# Patient Record
Sex: Male | Born: 1945 | Race: White | Hispanic: No | Marital: Married | Smoking: Former smoker
Health system: Southern US, Community
[De-identification: ages and names within clinical notes are randomized; demographics above are authoritative.]

## PROBLEM LIST (undated history)

## (undated) DIAGNOSIS — M1990 Unspecified osteoarthritis, unspecified site: Secondary | ICD-10-CM

## (undated) HISTORY — PX: FINGER AMPUTATION: SHX636

## (undated) HISTORY — DX: Unspecified osteoarthritis, unspecified site: M19.90

## (undated) HISTORY — PX: FOOT SURGERY: SHX648

---

## 2009-11-19 ENCOUNTER — Ambulatory Visit: Payer: Self-pay | Admitting: Internal Medicine

## 2010-06-26 DIAGNOSIS — Z720 Tobacco use: Secondary | ICD-10-CM | POA: Insufficient documentation

## 2010-06-26 DIAGNOSIS — J309 Allergic rhinitis, unspecified: Secondary | ICD-10-CM | POA: Insufficient documentation

## 2010-06-26 DIAGNOSIS — E7800 Pure hypercholesterolemia, unspecified: Secondary | ICD-10-CM | POA: Insufficient documentation

## 2010-09-11 DIAGNOSIS — L570 Actinic keratosis: Secondary | ICD-10-CM | POA: Insufficient documentation

## 2010-09-11 DIAGNOSIS — Z85828 Personal history of other malignant neoplasm of skin: Secondary | ICD-10-CM | POA: Insufficient documentation

## 2011-04-20 ENCOUNTER — Ambulatory Visit: Payer: Self-pay | Admitting: Internal Medicine

## 2014-01-20 DIAGNOSIS — N400 Enlarged prostate without lower urinary tract symptoms: Secondary | ICD-10-CM | POA: Insufficient documentation

## 2014-01-20 DIAGNOSIS — E7800 Pure hypercholesterolemia, unspecified: Secondary | ICD-10-CM | POA: Insufficient documentation

## 2014-05-25 ENCOUNTER — Ambulatory Visit: Payer: Self-pay | Admitting: Family Medicine

## 2014-07-04 DIAGNOSIS — Z83511 Family history of glaucoma: Secondary | ICD-10-CM | POA: Insufficient documentation

## 2014-10-10 ENCOUNTER — Other Ambulatory Visit: Payer: Self-pay | Admitting: Otolaryngology

## 2014-10-10 DIAGNOSIS — J3800 Paralysis of vocal cords and larynx, unspecified: Secondary | ICD-10-CM

## 2014-10-13 ENCOUNTER — Ambulatory Visit
Admission: RE | Admit: 2014-10-13 | Discharge: 2014-10-13 | Disposition: A | Discharge status: Home / Self Care | Payer: Medicare Other | Source: Ambulatory Visit | Attending: Otolaryngology | Admitting: Otolaryngology

## 2014-10-13 DIAGNOSIS — J3801 Paralysis of vocal cords and larynx, unilateral: Secondary | ICD-10-CM | POA: Diagnosis not present

## 2014-10-13 DIAGNOSIS — R490 Dysphonia: Secondary | ICD-10-CM | POA: Insufficient documentation

## 2014-10-13 DIAGNOSIS — I700 Atherosclerosis of aorta: Secondary | ICD-10-CM | POA: Insufficient documentation

## 2014-10-13 DIAGNOSIS — J3800 Paralysis of vocal cords and larynx, unspecified: Secondary | ICD-10-CM | POA: Diagnosis present

## 2014-10-13 MED ORDER — IOHEXOL 350 MG/ML SOLN
75.0000 mL | Freq: Once | INTRAVENOUS | Status: AC | PRN
  Administered 2014-10-13: 75 mL via INTRAVENOUS

## 2014-10-25 DIAGNOSIS — I712 Thoracic aortic aneurysm, without rupture: Secondary | ICD-10-CM | POA: Insufficient documentation

## 2014-10-25 DIAGNOSIS — I7121 Aneurysm of the ascending aorta, without rupture: Secondary | ICD-10-CM | POA: Insufficient documentation

## 2015-10-25 ENCOUNTER — Ambulatory Visit (INDEPENDENT_AMBULATORY_CARE_PROVIDER_SITE_OTHER): Payer: Medicare Other

## 2015-10-25 ENCOUNTER — Ambulatory Visit (INDEPENDENT_AMBULATORY_CARE_PROVIDER_SITE_OTHER): Payer: Medicare Other | Admitting: Podiatry

## 2015-10-25 ENCOUNTER — Encounter: Payer: Self-pay | Admitting: Podiatry

## 2015-10-25 VITALS — BP 133/84 | HR 80 | Resp 16

## 2015-10-25 DIAGNOSIS — M1990 Unspecified osteoarthritis, unspecified site: Secondary | ICD-10-CM | POA: Insufficient documentation

## 2015-10-25 DIAGNOSIS — M2011 Hallux valgus (acquired), right foot: Secondary | ICD-10-CM | POA: Diagnosis not present

## 2015-10-25 DIAGNOSIS — G5791 Unspecified mononeuropathy of right lower limb: Secondary | ICD-10-CM

## 2015-10-25 MED ORDER — DICLOFENAC SODIUM 1 % TD GEL: 4.0000 g | Freq: Four times a day (QID) | TRANSDERMAL | Status: DC

## 2015-10-25 NOTE — Progress Notes (Signed)
   Subjective:    Patient ID: Jaime Blackburn, male    DOB: 01/05/1946, 70 y.o.   MRN: [id number]  HPI: He presents today with a 9 month history of pressure and electrical pains across the first metatarsophalangeal joint and the fifth metatarsophalangeal joints right foot. He states that sometimes it feels like her foot is being squeezed the device and twisted. He states that he runs daily and does over thousand crunches a day. He vehemently denies any back problems or sciatic problems. He states that the pain only comes in the evening after he's been running for long distance and once he goes to bed he feels this discomfort across the top of the foot the dorsomedial and dorsolateral aspects. He states that he can roll over on his stomach position his foot against the mattress and the pain will go away. He denies any trauma but he does state that he wears his shoes incredibly tight. He states that his primary doctor suggested that it may be early signs of neuropathy which he does not agree with he also states that his primary offered gabapentin/Neurontin which she refused to take.    Review of Systems  Musculoskeletal: Positive for arthralgias.  Neurological: Positive for numbness.  All other systems reviewed and are negative.      Objective:   Physical Exam:Vital signs are stable alert and oriented 3 pulses are palpable. Neurologic sensorium is intact deep tendon reflexes are intact muscle strength is normal. Orthopedic evaluation of a straight solid joints distal to the ankle range of motion without crepitation. No palpable abnormalities of the joint. Has good range of motion of the first and fifth metatarsophalangeal joints and he does have some increase in symptoms with pressure to the tarsal canal and to the anterior foot. Radiographs taken today demonstrate well-healed first metatarsal osteotomy with screw fixation screw is in good position does not seem to be in position that would be necessary to  irritated nerve. No hardware to the fifth of a tailor's bunion deformity is present. Cutaneous evaluation demonstrates no open lesions or wounds.        Assessment & Plan:  Neuritis right foot.  Plan: Placed him on a topical nonsteroidal anti-inflammatory. We will follow-up with him on an as-needed basis. Next step would be a nerve conduction velocity exam.

## 2016-11-28 ENCOUNTER — Other Ambulatory Visit: Payer: Self-pay | Admitting: Physician Assistant

## 2016-11-28 DIAGNOSIS — R9389 Abnormal findings on diagnostic imaging of other specified body structures: Secondary | ICD-10-CM

## 2016-12-20 ENCOUNTER — Ambulatory Visit
Admission: RE | Admit: 2016-12-20 | Discharge: 2016-12-20 | Disposition: A | Discharge status: Home / Self Care | Payer: Non-veteran care | Source: Ambulatory Visit | Attending: Physician Assistant | Admitting: Physician Assistant

## 2016-12-20 DIAGNOSIS — R9389 Abnormal findings on diagnostic imaging of other specified body structures: Secondary | ICD-10-CM

## 2016-12-20 DIAGNOSIS — M7611 Psoas tendinitis, right hip: Secondary | ICD-10-CM | POA: Diagnosis not present

## 2016-12-20 DIAGNOSIS — K5730 Diverticulosis of large intestine without perforation or abscess without bleeding: Secondary | ICD-10-CM | POA: Diagnosis not present

## 2016-12-20 DIAGNOSIS — R938 Abnormal findings on diagnostic imaging of other specified body structures: Secondary | ICD-10-CM | POA: Diagnosis present

## 2018-01-15 ENCOUNTER — Inpatient Hospital Stay: Payer: Medicare Other

## 2018-01-15 ENCOUNTER — Inpatient Hospital Stay: Payer: Medicare Other | Attending: Oncology | Admitting: Oncology

## 2018-01-15 ENCOUNTER — Encounter: Payer: Self-pay | Admitting: Oncology

## 2018-01-15 ENCOUNTER — Other Ambulatory Visit: Payer: Self-pay

## 2018-01-15 VITALS — BP 156/81 | HR 64 | Temp 95.7°F

## 2018-01-15 DIAGNOSIS — Z801 Family history of malignant neoplasm of trachea, bronchus and lung: Secondary | ICD-10-CM | POA: Diagnosis not present

## 2018-01-15 DIAGNOSIS — Z800 Family history of malignant neoplasm of digestive organs: Secondary | ICD-10-CM | POA: Diagnosis not present

## 2018-01-15 DIAGNOSIS — Z87891 Personal history of nicotine dependence: Secondary | ICD-10-CM

## 2018-01-15 DIAGNOSIS — D696 Thrombocytopenia, unspecified: Secondary | ICD-10-CM

## 2018-01-15 DIAGNOSIS — D709 Neutropenia, unspecified: Secondary | ICD-10-CM

## 2018-01-15 DIAGNOSIS — Z7289 Other problems related to lifestyle: Secondary | ICD-10-CM | POA: Insufficient documentation

## 2018-01-15 LAB — CBC WITH DIFFERENTIAL/PLATELET
BASOS PCT: 1 %
Basophils Absolute: 0 10*3/uL (ref 0–0.1)
EOS ABS: 0.1 10*3/uL (ref 0–0.7)
Eosinophils Relative: 3 %
HCT: 43.7 % (ref 40.0–52.0)
Hemoglobin: 14.6 g/dL (ref 13.0–18.0)
Lymphocytes Relative: 34 %
Lymphs Abs: 1.3 10*3/uL (ref 1.0–3.6)
MCH: 31.4 pg (ref 26.0–34.0)
MCHC: 33.3 g/dL (ref 32.0–36.0)
MCV: 94.4 fL (ref 80.0–100.0)
MONO ABS: 0.5 10*3/uL (ref 0.2–1.0)
MONOS PCT: 13 %
NEUTROS PCT: 49 %
Neutro Abs: 1.9 10*3/uL (ref 1.4–6.5)
Platelets: 141 10*3/uL — ABNORMAL LOW (ref 150–440)
RBC: 4.63 MIL/uL (ref 4.40–5.90)
RDW: 13.7 % (ref 11.5–14.5)
WBC: 3.8 10*3/uL (ref 3.8–10.6)

## 2018-01-15 LAB — COMPREHENSIVE METABOLIC PANEL
ALBUMIN: 4.4 g/dL (ref 3.5–5.0)
ALK PHOS: 50 U/L (ref 38–126)
ALT: 22 U/L (ref 0–44)
ANION GAP: 8 (ref 5–15)
AST: 36 U/L (ref 15–41)
BILIRUBIN TOTAL: 0.8 mg/dL (ref 0.3–1.2)
BUN: 13 mg/dL (ref 8–23)
CALCIUM: 8.8 mg/dL — AB (ref 8.9–10.3)
CO2: 28 mmol/L (ref 22–32)
Chloride: 101 mmol/L (ref 98–111)
Creatinine, Ser: 0.88 mg/dL (ref 0.61–1.24)
GFR calc Af Amer: >60 mL/min (ref >60)
GLUCOSE: 98 mg/dL (ref 70–99)
Potassium: 4.1 mmol/L (ref 3.5–5.1)
Sodium: 137 mmol/L (ref 135–145)
TOTAL PROTEIN: 7.4 g/dL (ref 6.5–8.1)

## 2018-01-15 LAB — LACTATE DEHYDROGENASE: LDH: 187 U/L (ref 98–192)

## 2018-01-15 LAB — VITAMIN B12: Vitamin B-12: 705 pg/mL (ref 180–914)

## 2018-01-15 LAB — PATHOLOGIST SMEAR REVIEW

## 2018-01-16 LAB — HEPATITIS PANEL, ACUTE
HEP B S AG: NEGATIVE
Hep A IgM: NEGATIVE
Hep B C IgM: NEGATIVE

## 2018-01-16 LAB — FOLATE: FOLATE: 16.4 ng/mL (ref >5.9)

## 2018-01-17 ENCOUNTER — Encounter: Payer: Self-pay | Admitting: Oncology

## 2018-01-17 NOTE — Progress Notes (Addendum)
Hematology/Oncology Consult note [facility] Telephone:([phone] [phone]   Patient Care Team: Hortencia Pilar, MD as PCP - General (Family Medicine)  REFERRING PROVIDER: Hortencia Pilar, MD CHIEF COMPLAINTS/REASON FOR VISIT:  Evaluation of thrombocytopenia and neutropenia.  HISTORY OF PRESENTING ILLNESS:  Jaime Blackburn is a  72 y.o.  male with PMH listed below who was referred to me for evaluation of thrombocytopenia and neutropenia. Patient recently had lab work done on 01/06/2018 which revealed thrombocytopenia, platelet counts 140,000.  Neutropenia with ANC 1.6.  Patient brought his old lab records.  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  05/07/2013 wbc 7.07, Hb 14.8, hct 45.7, platelet 150,000 05/12/2015 wbc 3.83, Hb 15.6, Hct 46.1, platelet 120,000 05/14/2016 wbc 3.9, Hb 15.2, Hct 44.9, platelet 141,000.    No aggravating or improving factors. Associated symptoms: Patient denies fatigue, weight loss, easy bruising, hematochezia, hemoptysis.  History hepatitis or HIV infection. denies History of chronic liver disease: denies Alcohol consumption: yes, 2 shots daily.     Review of Systems  Constitutional: Negative for chills, fever, malaise/fatigue and weight loss.  HENT: Negative for nosebleeds and sore throat.   Eyes: Negative for double vision, photophobia and redness.  Respiratory: Negative for cough, shortness of breath and wheezing.   Cardiovascular: Negative for chest pain, palpitations and orthopnea.  Gastrointestinal: Negative for abdominal pain, blood in stool, nausea and vomiting.  Genitourinary: Negative for dysuria.  Musculoskeletal: Negative for back pain, myalgias and neck pain.  Skin: Negative for itching and rash.  Neurological: Negative for dizziness, tingling and tremors.  Endo/Heme/Allergies: Negative for environmental allergies. Does not bruise/bleed easily.    Psychiatric/Behavioral: Negative for depression.    MEDICAL HISTORY:  Past Medical History:  Diagnosis Date  . Arthritis     SURGICAL HISTORY: Past Surgical History:  Procedure Laterality Date  . FINGER AMPUTATION    . FOOT SURGERY      SOCIAL HISTORY: Social History   Socioeconomic History  . Marital status: Married    Spouse name: Not on file  . Number of children: 1  . Years of education: Not on file  . Highest education level: Not on file  Occupational History  . Not on file  Social Needs  . Financial resource strain: Not on file  . Food insecurity:    Worry: Not on file    Inability: Not on file  . Transportation needs:    Medical: Not on file    Non-medical: Not on file  Tobacco Use  . Smoking status: Former Smoker    Packs/day: 1.00    Years: 30.00    Pack years: 30.00    Types: Cigarettes    Last attempt to quit: 01/16/2012    Years since quitting: 6.0  . Smokeless tobacco: Never Used  Substance and Sexual Activity  . Alcohol use: Yes    Alcohol/week: 14.0 standard drinks    Types: 14 Shots of liquor per week    Comment: 2 shots daily   . Drug use: Never  . Sexual activity: Not on file  Lifestyle  . Physical activity:    Days per week: Not on file    Minutes per session: Not on file  . Stress: Not on file  Relationships  . Social connections:    Talks on phone: Not on file    Gets together: Not on file    Attends religious service: Not on file    Active member of  club or organization: Not on file    Attends meetings of clubs or organizations: Not on file    Relationship status: Not on file  . Intimate partner violence:    Fear of current or ex partner: Not on file    Emotionally abused: Not on file    Physically abused: Not on file    Forced sexual activity: Not on file  Other Topics Concern  . Not on file  Social History Narrative  . Not on file    FAMILY HISTORY: Family History  Problem Relation Age of Onset  . Colon cancer  Mother   . Stroke Mother   . Tuberculosis Father   . Stroke Maternal Grandmother   . Lung cancer Paternal Grandfather     ALLERGIES:  is allergic to propoxyphene.  MEDICATIONS:  Current Outpatient Medications  Medication Sig Dispense Refill  . atorvastatin (LIPITOR) 40 MG tablet     . bimatoprost (LUMIGAN) 0.01 % SOLN Apply to eye.    . diclofenac sodium (VOLTAREN) 1 % GEL Apply 4 g topically 4 (four) times daily. 100 g 4  . fluticasone (FLONASE) 50 MCG/ACT nasal spray     . ibuprofen (ADVIL,MOTRIN) 200 MG tablet Take by mouth.    . nystatin ointment (MYCOSTATIN)     . Omega 3 1200 MG CAPS Take by mouth.    . tamsulosin (FLOMAX) 0.4 MG CAPS capsule      No current facility-administered medications for this visit.      PHYSICAL EXAMINATION: ECOG PERFORMANCE STATUS: 0 - Asymptomatic Vitals:   01/19/18 1715  BP: (!) 156/81  Pulse: 64  Temp: (!) 95.7 F (35.4 C)   There were no vitals filed for this visit.  Physical Exam  Constitutional: He is oriented to person, place, and time. No distress.  HENT:  Head: Normocephalic and atraumatic.  Mouth/Throat: Oropharynx is clear and moist.  Eyes: Pupils are equal, round, and reactive to light. EOM are normal. No scleral icterus.  Neck: Normal range of motion. Neck supple.  Cardiovascular: Normal rate, regular rhythm and normal heart sounds.  Pulmonary/Chest: Effort normal. No respiratory distress. He has no wheezes.  Abdominal: Soft. Bowel sounds are normal. He exhibits no distension and no mass. There is no tenderness.  Musculoskeletal: Normal range of motion. He exhibits no edema or deformity.  Neurological: He is alert and oriented to person, place, and time. No cranial nerve deficit. Coordination normal.  Skin: Skin is warm and dry. No rash noted. No erythema.  Psychiatric: He has a normal mood and affect. His behavior is normal. Thought content normal.     LABORATORY DATA:  I have reviewed the data as listed Lab Results   Component Value Date   WBC 3.8 01/15/2018   HGB 14.6 01/15/2018   HCT 43.7 01/15/2018   MCV 94.4 01/15/2018   PLT 141 (L) 01/15/2018   Recent Labs    01/15/18 1016  NA 137  K 4.1  CL 101  CO2 28  GLUCOSE 98  BUN 13  CREATININE 0.88  CALCIUM 8.8*  GFRNONAA >60  GFRAA >60  PROT 7.4  ALBUMIN 4.4  AST 36  ALT 22  ALKPHOS 50  BILITOT 0.8   Iron/TIBC/Ferritin/ %Sat No results found for: IRON, TIBC, FERRITIN, IRONPCTSAT      ASSESSMENT & PLAN:  1. Neutropenia, unspecified type ([place])   2. Thrombocytopenia ([place])    For the work up of patient's thrombocytopenia and neutropenia, I recommend checking CBC;CMP, LDH; smear review, folate,  Vitamin B12, hepatitis, HIV, ANA,  flowcytometry . Also, discussed with the patient that if no clear etiology found- bone marrow biopsy would be suggested. Currently await for the above workup.   Also discussed with patient that chronic alcohol consumption can also lead to bone marrow toxicity and cause low counts. Advise alcohol cessation.   # Patient follow-up with me in approximately 2 weeks to review the above results.   Orders Placed This Encounter  Procedures  . CBC with Differential/Platelet    Standing Status:   Future    Number of Occurrences:   1    Standing Expiration Date:   01/16/2019  . Pathologist smear review    Standing Status:   Future    Number of Occurrences:   1    Standing Expiration Date:   01/16/2019  . Comprehensive metabolic panel    Standing Status:   Future    Number of Occurrences:   1    Standing Expiration Date:   01/16/2019  . Folate    Standing Status:   Future    Number of Occurrences:   1    Standing Expiration Date:   01/16/2019  . Vitamin B12    Standing Status:   Future    Number of Occurrences:   1    Standing Expiration Date:   01/16/2019  . Flow cytometry panel-leukemia/lymphoma work-up    Standing Status:   Future    Number of Occurrences:   1    Standing Expiration Date:   01/16/2019  .  Hepatitis panel, acute    Standing Status:   Future    Number of Occurrences:   1    Standing Expiration Date:   01/16/2019  . HIV Antibody (routine testing w rflx)    Standing Status:   Future    Number of Occurrences:   1    Standing Expiration Date:   01/16/2019  . Lactate dehydrogenase    Standing Status:   Future    Number of Occurrences:   1    Standing Expiration Date:   01/16/2019    All questions were answered. The patient knows to call the clinic with any problems questions or concerns.  Return of visit:  Thank you for this kind referral and the opportunity to participate in the care of this patient. A copy of today's note is routed to referring provider  Total face to face encounter time for this patient visit was 45 min. >50% of the time was  spent in counseling and coordination of care.    Earlie Server, MD, PhD Hematology Oncology [facility] at [facility] Pager- [phone] 01/17/2018

## 2018-01-18 LAB — HIV 1/2 AB DIFFERENTIATION
HIV 1 AB: NEGATIVE
HIV 2 AB: NEGATIVE
Note: NEGATIVE

## 2018-01-18 LAB — RNA QUALITATIVE: HIV 1 RNA Qualitative: 1

## 2018-01-18 LAB — HIV ANTIBODY (ROUTINE TESTING W REFLEX): HIV SCREEN 4TH GENERATION: REACTIVE — AB

## 2018-01-19 LAB — COMP PANEL: LEUKEMIA/LYMPHOMA

## 2018-01-29 ENCOUNTER — Encounter: Payer: Self-pay | Admitting: Oncology

## 2018-01-29 ENCOUNTER — Inpatient Hospital Stay: Payer: Medicare Other | Attending: Oncology | Admitting: Oncology

## 2018-01-29 ENCOUNTER — Other Ambulatory Visit: Payer: Self-pay

## 2018-01-29 VITALS — BP 125/71 | HR 67 | Temp 95.7°F | Resp 16 | Wt 190.5 lb

## 2018-01-29 DIAGNOSIS — D709 Neutropenia, unspecified: Secondary | ICD-10-CM | POA: Insufficient documentation

## 2018-01-29 DIAGNOSIS — Z87891 Personal history of nicotine dependence: Secondary | ICD-10-CM | POA: Diagnosis not present

## 2018-01-29 DIAGNOSIS — D696 Thrombocytopenia, unspecified: Secondary | ICD-10-CM | POA: Diagnosis present

## 2018-01-29 DIAGNOSIS — F1010 Alcohol abuse, uncomplicated: Secondary | ICD-10-CM | POA: Diagnosis not present

## 2018-01-29 DIAGNOSIS — Z79899 Other long term (current) drug therapy: Secondary | ICD-10-CM | POA: Diagnosis not present

## 2018-01-29 NOTE — Progress Notes (Signed)
Hematology/Oncology Consult note [facility] Telephone:([phone] Fax:([phone]   Patient Care Team: Rolm Gala, MD as PCP - General (Family Medicine)  REFERRING PROVIDER: Rolm Gala, MD REASON FOR VISIT Follow up for treatment of   Evaluation of thrombocytopenia and neutropenia.  HISTORY OF PRESENTING ILLNESS:  Jaime Blackburn is a  72 y.o.  male with PMH listed below who was referred to me for evaluation of thrombocytopenia and neutropenia. Patient recently had lab work done on 01/06/2018 which revealed thrombocytopenia, platelet counts 140,000.  Neutropenia with ANC 1.6.  Patient brought his old lab records.  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  05/07/2013 wbc 7.07, Hb 14.8, hct 45.7, platelet 150,000 05/12/2015 wbc 3.83, Hb 15.6, Hct 46.1, platelet 120,000 05/14/2016 wbc 3.9, Hb 15.2, Hct 44.9, platelet 141,000.    No aggravating or improving factors. Associated symptoms: Patient denies fatigue, weight loss, easy bruising, hematochezia, hemoptysis.  History hepatitis or HIV infection. denies History of chronic liver disease: denies Alcohol consumption: yes, 2 shots of scotch daily.   INTERVAL HISTORY Jaime Blackburn is a 72 y.o. male who has above history reviewed by me today presents for follow up visit for management of thrombocytopenia. Patient had lab work-up done during the interval.  Presents to discuss results and future management plan. He has no new complaints today.  Review of Systems  Constitutional: Negative for chills, fever, malaise/fatigue and weight loss.  HENT: Negative for nosebleeds and sore throat.   Eyes: Negative for double vision, photophobia and redness.  Respiratory: Negative for cough, shortness of breath and wheezing.   Cardiovascular: Negative for chest pain, palpitations and orthopnea.  Gastrointestinal: Negative for abdominal pain, blood in stool,  nausea and vomiting.  Genitourinary: Negative for dysuria.  Musculoskeletal: Negative for back pain, myalgias and neck pain.  Skin: Negative for itching and rash.  Neurological: Negative for dizziness, tingling and tremors.  Endo/Heme/Allergies: Negative for environmental allergies. Does not bruise/bleed easily.  Psychiatric/Behavioral: Negative for depression.    MEDICAL HISTORY:  Past Medical History:  Diagnosis Date  . Arthritis     SURGICAL HISTORY: Past Surgical History:  Procedure Laterality Date  . FINGER AMPUTATION    . FOOT SURGERY      SOCIAL HISTORY: Social History   Socioeconomic History  . Marital status: Married    Spouse name: Not on file  . Number of children: 1  . Years of education: Not on file  . Highest education level: Not on file  Occupational History  . Not on file  Social Needs  . Financial resource strain: Not on file  . Food insecurity:    Worry: Not on file    Inability: Not on file  . Transportation needs:    Medical: Not on file    Non-medical: Not on file  Tobacco Use  . Smoking status: Former Smoker    Packs/day: 1.00    Years: 30.00    Pack years: 30.00    Types: Cigarettes    Last attempt to quit: 01/16/2012    Years since quitting: 6.0  . Smokeless tobacco: Never Used  Substance and Sexual Activity  . Alcohol use: Yes    Alcohol/week: 14.0 standard drinks    Types: 14 Shots of liquor per week    Comment: 2 shots daily   . Drug use: Never  . Sexual activity: Not on file  Lifestyle  . Physical activity:    Days per week: Not  on file    Minutes per session: Not on file  . Stress: Not on file  Relationships  . Social connections:    Talks on phone: Not on file    Gets together: Not on file    Attends religious service: Not on file    Active member of club or organization: Not on file    Attends meetings of clubs or organizations: Not on file    Relationship status: Not on file  . Intimate partner violence:    Fear of  current or ex partner: Not on file    Emotionally abused: Not on file    Physically abused: Not on file    Forced sexual activity: Not on file  Other Topics Concern  . Not on file  Social History Narrative  . Not on file    FAMILY HISTORY: Family History  Problem Relation Age of Onset  . Colon cancer Mother   . Stroke Mother   . Tuberculosis Father   . Stroke Maternal Grandmother   . Lung cancer Paternal Grandfather     ALLERGIES:  is allergic to propoxyphene.  MEDICATIONS:  Current Outpatient Medications  Medication Sig Dispense Refill  . atorvastatin (LIPITOR) 40 MG tablet     . bimatoprost (LUMIGAN) 0.01 % SOLN Apply to eye.    . diclofenac sodium (VOLTAREN) 1 % GEL Apply 4 g topically 4 (four) times daily. 100 g 4  . fluticasone (FLONASE) 50 MCG/ACT nasal spray     . ibuprofen (ADVIL,MOTRIN) 200 MG tablet Take by mouth.    . nystatin ointment (MYCOSTATIN)     . Omega 3 1200 MG CAPS Take by mouth.    . tamsulosin (FLOMAX) 0.4 MG CAPS capsule      No current facility-administered medications for this visit.      PHYSICAL EXAMINATION: ECOG PERFORMANCE STATUS: 0 - Asymptomatic Vitals:   01/29/18 0956  BP: 125/71  Pulse: 67  Resp: 16  Temp: (!) 95.7 F (35.4 C)   Filed Weights   01/29/18 0956  Weight: 190 lb 7.6 oz (86.4 kg)    Physical Exam  Constitutional: He is oriented to person, place, and time. No distress.  HENT:  Head: Normocephalic and atraumatic.  Mouth/Throat: Oropharynx is clear and moist.  Eyes: Pupils are equal, round, and reactive to light. EOM are normal. No scleral icterus.  Neck: Normal range of motion. Neck supple.  Cardiovascular: Normal rate, regular rhythm and normal heart sounds.  Pulmonary/Chest: Effort normal. No respiratory distress. He has no wheezes.  Abdominal: Soft. Bowel sounds are normal. He exhibits no distension and no mass. There is no tenderness.  Musculoskeletal: Normal range of motion. He exhibits no edema or  deformity.  Neurological: He is alert and oriented to person, place, and time. No cranial nerve deficit. Coordination normal.  Skin: Skin is warm and dry. No rash noted. No erythema.  Psychiatric: He has a normal mood and affect. His behavior is normal. Thought content normal.     LABORATORY DATA:  I have reviewed the data as listed Lab Results  Component Value Date   WBC 3.8 01/15/2018   HGB 14.6 01/15/2018   HCT 43.7 01/15/2018   MCV 94.4 01/15/2018   PLT 141 (L) 01/15/2018   Recent Labs    01/15/18 1016  NA 137  K 4.1  CL 101  CO2 28  GLUCOSE 98  BUN 13  CREATININE 0.88  CALCIUM 8.8*  GFRNONAA >60  GFRAA >60  PROT 7.4  ALBUMIN 4.4  AST 36  ALT 22  ALKPHOS 50  BILITOT 0.8   Iron/TIBC/Ferritin/ %Sat No results found for: IRON, TIBC, FERRITIN, IRONPCTSAT      ASSESSMENT & PLAN:  1. Neutropenia, unspecified type ([facility])   2. Thrombocytopenia ([facility])    #Lab work-up results discussed with patient.  He has no neutropenia.  Only very mild thrombocytopenia, grade 1. Normal LDH, smear review, folate and B12 level.  Negative hepatitis and HIV.  Flow cytometry negative. Discussed with patient that I think he is thrombocytopenia is most likely due to chronic alcohol use.  Alcohol cessation discussed with patient. He can follow-up in the office in 4 months.  He told me that he will have labs done at [facility] and will bring his labs for me to review.   All questions were answered. The patient knows to call the clinic with any problems questions or concerns.  Return of visit: 12-month  Rickard Patience, MD, PhD Hematology Oncology [facility] at [facility] Pager- [phone] 01/29/2018

## 2018-01-29 NOTE — Progress Notes (Signed)
Patient here today for follow up.  Patient states no new concerns today  

## 2018-02-23 ENCOUNTER — Encounter: Payer: Self-pay | Admitting: Physician Assistant

## 2018-06-04 ENCOUNTER — Ambulatory Visit: Payer: Medicare Other | Admitting: Oncology

## 2018-06-04 ENCOUNTER — Other Ambulatory Visit: Payer: Medicare Other

## 2018-06-04 ENCOUNTER — Inpatient Hospital Stay: Payer: Medicare Other | Attending: Hematology and Oncology | Admitting: Hematology and Oncology

## 2018-06-04 ENCOUNTER — Encounter: Payer: Self-pay | Admitting: Hematology and Oncology

## 2018-06-04 VITALS — BP 145/83 | HR 70 | Temp 97.7°F | Resp 16 | Wt 188.7 lb

## 2018-06-04 DIAGNOSIS — D696 Thrombocytopenia, unspecified: Secondary | ICD-10-CM | POA: Diagnosis present

## 2018-06-04 DIAGNOSIS — Z79899 Other long term (current) drug therapy: Secondary | ICD-10-CM

## 2018-06-04 DIAGNOSIS — Z87891 Personal history of nicotine dependence: Secondary | ICD-10-CM | POA: Diagnosis not present

## 2018-06-04 DIAGNOSIS — F1010 Alcohol abuse, uncomplicated: Secondary | ICD-10-CM | POA: Diagnosis not present

## 2018-06-04 DIAGNOSIS — D709 Neutropenia, unspecified: Secondary | ICD-10-CM | POA: Diagnosis present

## 2018-06-04 DIAGNOSIS — D72819 Decreased white blood cell count, unspecified: Secondary | ICD-10-CM | POA: Diagnosis not present

## 2018-06-04 LAB — CBC WITH DIFFERENTIAL/PLATELET
ABS IMMATURE GRANULOCYTES: 0.01 10*3/uL (ref 0.00–0.07)
Basophils Absolute: 0.1 10*3/uL (ref 0.0–0.1)
Basophils Relative: 1 %
Eosinophils Absolute: 0.1 10*3/uL (ref 0.0–0.5)
Eosinophils Relative: 2 %
HCT: 44.7 % (ref 39.0–52.0)
Hemoglobin: 14.8 g/dL (ref 13.0–17.0)
Immature Granulocytes: 0 %
Lymphocytes Relative: 33 %
Lymphs Abs: 1.3 10*3/uL (ref 0.7–4.0)
MCH: 31.2 pg (ref 26.0–34.0)
MCHC: 33.1 g/dL (ref 30.0–36.0)
MCV: 94.1 fL (ref 80.0–100.0)
Monocytes Absolute: 0.5 10*3/uL (ref 0.1–1.0)
Monocytes Relative: 12 %
NEUTROS ABS: 2 10*3/uL (ref 1.7–7.7)
NEUTROS PCT: 52 %
Platelets: 146 10*3/uL — ABNORMAL LOW (ref 150–400)
RBC: 4.75 MIL/uL (ref 4.22–5.81)
RDW: 12.5 % (ref 11.5–15.5)
WBC: 3.9 10*3/uL — ABNORMAL LOW (ref 4.0–10.5)
nRBC: 0 % (ref 0.0–0.2)

## 2018-06-04 LAB — PROTIME-INR
INR: 0.96
Prothrombin Time: 12.7 seconds (ref 11.4–15.2)

## 2018-06-04 LAB — APTT: aPTT: 30 seconds (ref 24–36)

## 2018-06-04 NOTE — Progress Notes (Signed)
Pt here for follow up. Previous Dr. Yu patient. Denies any concerns at this time.  

## 2018-06-04 NOTE — Progress Notes (Signed)
[facility] day:  06/04/2018  Chief Complaint: Jaime Blackburn is a 73 y.o. male with anemia and thrombocytopenia who is seen for new patient assessment.  HPI:  The patient was initially seen in consultation by Dr. Tasia Catchings on 01/15/2018.   Labs had revealed thrombocytopenia.  Platelet count was 140,000.  He had neutropenia with ANC 1600. He denied any B symptoms.   He denied any hepatitis or chronic liver disease.  He was noted to drink 2 shots of Scotch daily.   Old lab records brought in by the patient revealed chronic thrombocytopenia dating back to 2017.  05/07/2013:   WBC 7070, hemoglobin 14.8, hematocrit 45.7, platelets 150,000 05/12/2015:   WBC 3830, hemoglobin 15.6, hematocrit 46.1, platelets 120,000 05/14/2016:   WBC 3900, hemoglobin 15.2, hematocrit 44.9, platelets 141,000.  05/07/2017:   WBC 3800, hemoglobin 14.7, hematocrit 44.6, platelets 138,000.  He underwent a work-up. CBC revealed a hematocrit of 43.7, hemoglobin 14.6, MCV 94.4, platelets 141,000, WBC 3800 with an ANC of 1900.  Normal labs included:  B12 (705), folate (16.4), LDH (187), CMP, acute hepatitis panel, flow cytometry, HIV 1 and 2 antibody, and HIV1 RNA qualitative.  Peripheral smear revealed very mild thrombocytopenia without clumping.  RBCs were unremarkable. The WBC and differential were within normal limits.  There were no abnormal or immature cells.    Flow cytometry revealed no significant immunophenotypic abnormality.  There was no monoclonal B cell population. There was no loss of, or aberrant expression of, the pan T cell antigens to suggest a neoplastic T cell process. CD4:CD8 ratio was 2.5   There were no circulating blasts. There was no immunophenotypic evidence of abnormal myeloid maturation. Analysis of the leukocyte population showed: granulocytes 64%, monocytes 9%, lymphocytes 27%, blasts <0.5%, B cells 5%, T cells 16%, NK cells 6%.  He was felt to have only very mild  thrombocytopenia, grade 1.  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  Alcohol cessation was discussed with patient.  Symptomatically, he is doing well today. He does not make note of any acute or concerning symptoms. He feels generally well. Patient denies that he has experienced any B symptoms. He denies any interval infections. Patient denies bleeding; no hematochezia, melena, or gross hematuria. He has not experienced any areas of unexplained bruising. He denies any history of blood transfusion.  Patient advises that he maintains an adequate appetite. He is eating well. Weight today is 188 lb 11.4 oz (85.6 kg), which compared to his last visit to the clinic, represents a 2 pound decrease. He denies pain in the clinic today.    Past Medical History:  Diagnosis Date  . Arthritis     Past Surgical History:  Procedure Laterality Date  . FINGER AMPUTATION    . FOOT SURGERY      Family History  Problem Relation Age of Onset  . Colon cancer Mother   . Stroke Mother   . Tuberculosis Father   . Stroke Maternal Grandmother   . Lung cancer Paternal Grandfather     Social History:  reports that he quit smoking about 6 years ago. His smoking use included cigarettes. He has a 30.00 pack-year smoking history. He has never used smokeless tobacco. He reports current alcohol use of about 14.0 standard drinks of alcohol per week. He reports that he does not use drugs.  Patient is retired from the [employer] engineer).   He was in [place], [place] and [place].  He had malaria in 1987.  Patient had several "exposures" to biologics/radiation/toxins while in the [employer]. The patient is alone today.  Allergies:  Allergies  Allergen Reactions  . Propoxyphene     Other reaction(s): Other (See Comments)    Current Medications: Current Outpatient Medications  Medication Sig Dispense Refill  . atorvastatin (LIPITOR) 40 MG tablet Take 40 mg by mouth daily at 6 PM.     .  bimatoprost (LUMIGAN) 0.01 % SOLN Place 1 drop into both eyes at bedtime.     . diclofenac sodium (VOLTAREN) 1 % GEL Apply 4 g topically 4 (four) times daily. 100 g 4  . fluticasone (FLONASE) 50 MCG/ACT nasal spray Place 1 spray into both nostrils as needed.     Marland Kitchen ibuprofen (ADVIL,MOTRIN) 200 MG tablet Take 200 mg by mouth at bedtime.     . Multiple Vitamins-Minerals (MULTIVITAMIN ADULT PO) Take 1 tablet by mouth daily.    Marland Kitchen nystatin ointment (MYCOSTATIN) Apply 1 application topically as needed.     . Omega 3 1200 MG CAPS Take 1 capsule by mouth daily.     . tamsulosin (FLOMAX) 0.4 MG CAPS capsule Take 0.4 mg by mouth as needed.      No current facility-administered medications for this visit.     Review of Systems:  GENERAL:  Feels good.  No fevers, sweats or weight loss. PERFORMANCE STATUS (ECOG):  0 HEENT:  Allergies.  No visual changes, runny nose, sore throat, mouth sores or tenderness. Lungs: No shortness of breath or cough.  No hemoptysis. Cardiac:  No chest pain, palpitations, orthopnea, or PND. GI:  No nausea, vomiting, diarrhea, constipation, melena or hematochezia. GU:  No urgency, frequency, dysuria, or hematuria. Musculoskeletal:  No back pain.  Bilateral knee and right foot arthritis.  No muscle tenderness. Extremities:  No pain or swelling. Skin:  No rashes or skin changes. Neuro:  No headache, numbness or weakness, balance or coordination issues. Endocrine:  No diabetes, thyroid issues, hot flashes or night sweats. Psych:  No mood changes, depression or anxiety. Pain:  No focal pain. Review of systems:  All other systems reviewed and found to be negative.  Physical Exam: Blood pressure (!) 145/83, pulse 70, temperature 97.7 F (36.5 C), temperature source Oral, resp. rate 16, weight 188 lb 11.4 oz (85.6 kg), SpO2 98 %. GENERAL:  Well developed, well nourished, gentleman sitting comfortably in the exam room in no acute distress. MENTAL STATUS:  Alert and oriented to  person, place and time. HEAD:  Short gray hair.  Normocephalic, atraumatic, face symmetric, no Cushingoid features. EYES:  Blue eyes.  Pupils equal round and reactive to light and accomodation.  No conjunctivitis or scleral icterus. ENT:  Oropharynx clear without lesion.  Tongue normal. Mucous membranes moist.  RESPIRATORY:  Clear to auscultation without rales, wheezes or rhonchi. CARDIOVASCULAR:  Regular rate and rhythm without murmur, rub or gallop. ABDOMEN:  Soft, non-tender, with active bowel sounds, and no hepatosplenomegaly.  No masses. SKIN:  No rashes, ulcers or lesions. 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  No edema, no skin discoloration or tenderness.  No palpable cords. LYMPH NODES: No palpable cervical, supraclavicular, axillary or inguinal adenopathy  NEUROLOGICAL: Unremarkable. PSYCH:  Appropriate.   No visits with results within 3 Day(s) from this visit.  Latest known visit with results is:  Clinical Support on 01/15/2018  Component Date Value Ref Range Status  . LDH 01/15/2018 187  98 - 192 U/L Final   Performed  at [facility], [address], [place], [place] [address]  . HIV Screen 4th Generation wRfx 01/15/2018 Reactive* Non Reactive Final   Comment: (NOTE) See additional algorithm testing elsewhere in this report. Performed At: [facility] [place], [place] [id number] Rush Farmer MD PP:[id number]   . Hepatitis B Surface Ag 01/15/2018 Negative  Negative Final  . HCV Ab 01/15/2018 <0.1  0.0 - 0.9 s/co ratio Final   Comment: (NOTE)                                  Negative:     < 0.8                             Indeterminate: 0.8 - 0.9                                  Positive:     > 0.9 The CDC recommends that a positive HCV antibody result be followed up with a HCV Nucleic Acid Amplification test (580998). Performed At: [facility] [place], [place] [id number] Rush Farmer MD  JQ:[id number]   . Hep A IgM 01/15/2018 Negative  Negative Final  . Hep B C IgM 01/15/2018 Negative  Negative Final  . PATH INTERP XXX-IMP 01/15/2018 Comment   Final   No significant immunophenotypic abnormality detected  . CLINICAL INFO 01/15/2018 Comment   Corrected   Comment: (NOTE) Accompanying CBC dated 01/15/18 shows: WBC count 3.8, Neu 1.9, Lym 1.3, Mon 0.5, Plt 141K   . Specimen Type 01/15/2018 Comment   Final   Peripheral blood  . ASSESSMENT OF LEUKOCYTES 01/15/2018 Comment   Final   Comment: (NOTE) No monoclonal B cell population is detected. There is no loss of, or aberrant expression of, the pan T cell antigens to suggest a neoplastic T cell process. CD4:CD8 ratio 2.5 No circulating blasts are detected. There is no immunophenotypic evidence of abnormal myeloid maturation. Analysis of the leukocyte population shows: granulocytes 64%, monocytes 9%, lymphocytes 27%, blasts <0.5%, B cells 5%, T cells 16%, NK cells 6%.   . % Viable Cells 01/15/2018 Comment   Corrected   90%  . ANALYSIS AND GATING STRATEGY 01/15/2018 Comment   Final   8 color analysis with CD45/SSC gating  . IMMUNOPHENOTYPING STUDY 01/15/2018 Comment   Final   Comment: (NOTE) CD2       Normal         CD3       Normal CD4       Normal         CD5       Normal CD7       Normal         CD8       Normal CD10      Normal         CD11b     Normal CD13      Normal         CD14      Normal CD16      Normal         CD19      Normal CD20      Normal         CD33      Normal CD34  Normal         CD38      Normal CD45      Normal         CD56      Normal CD57      Normal         CD117     Normal HLA-DR    Normal         KAPPA     Normal LAMBDA    Normal         CD64      Normal   . PATHOLOGIST NAME 01/15/2018 Comment   Final   Cecilie Kicks, M. D.  . COMMENT: 01/15/2018 Comment   Corrected   Comment: (NOTE) Each antibody in this assay was utilized to assess for potential abnormalities of studied cell  populations or to characterize identified abnormalities. This test was developed and its performance characteristics determined by [facility].  It has not been cleared or approved by the U.S. Food and Drug Administration. The FDA has determined that such clearance or approval is not necessary. This test is used for clinical purposes.  It should not be regarded as investigational or for research. Performed At: -[facility] [place], [place] [id number] Nechama Guard MD EQ:[id number] Performed At: [facility] [address] [place], [place] [id number] Nechama Guard MD ER:[id number]   . Vitamin B-12 01/15/2018 705  180 - 914 pg/mL Final   Comment: (NOTE) This assay is not validated for testing neonatal or myeloproliferative syndrome specimens for Vitamin B12 levels. Performed at [facility], [place] [address] [place], [place] [address]   . Folate 01/15/2018 16.4  >5.9 ng/mL Final   Performed at [facility], [address] [place], [place] [address]  . Sodium 01/15/2018 137  135 - 145 mmol/L Final  . Potassium 01/15/2018 4.1  3.5 - 5.1 mmol/L Final  . Chloride 01/15/2018 101  98 - 111 mmol/L Final  . CO2 01/15/2018 28  22 - 32 mmol/L Final  . Glucose, Bld 01/15/2018 98  70 - 99 mg/dL Final  . BUN 01/15/2018 13  8 - 23 mg/dL Final  . Creatinine, Ser 01/15/2018 0.88  0.61 - 1.24 mg/dL Final  . Calcium 01/15/2018 8.8* 8.9 - 10.3 mg/dL Final  . Total Protein 01/15/2018 7.4  6.5 - 8.1 g/dL Final  . Albumin 01/15/2018 4.4  3.5 - 5.0 g/dL Final  . AST 01/15/2018 36  15 - 41 U/L Final  . ALT 01/15/2018 22  0 - 44 U/L Final  . Alkaline Phosphatase 01/15/2018 50  38 - 126 U/L Final  . Total Bilirubin 01/15/2018 0.8  0.3 - 1.2 mg/dL Final  . GFR calc non Af Amer 01/15/2018 >60  >60 mL/min Final  . GFR calc Af Amer 01/15/2018 >60  >60 mL/min Final   Comment: (NOTE) The eGFR has been calculated using the CKD EPI equation. This calculation has  not been validated in all clinical situations. eGFR's persistently <60 mL/min signify possible Chronic Kidney Disease.   Georgiann Hahn gap 01/15/2018 8  5 - 15 Final   Performed at [facility], [address], [place], [place] [address]  . Path Review 01/15/2018 Blood smear reviewed. There is very mild thrombocytopenia without clumping. RBCs are unremarkable. The WBC and differential are within normal limits. No abnormal or immature cells are seen.   Final   Comment: Reviewed by Lemmie Evens. Dicie Beam, MD. Performed at  [facility], [address] [place], [place] [address]   . WBC 01/15/2018 3.8  3.8 - 10.6 K/uL Final  . RBC 01/15/2018 4.63  4.40 - 5.90 MIL/uL Final  . Hemoglobin 01/15/2018 14.6  13.0 - 18.0 g/dL Final  . HCT 01/15/2018 43.7  40.0 - 52.0 % Final  . MCV 01/15/2018 94.4  80.0 - 100.0 fL Final  . MCH 01/15/2018 31.4  26.0 - 34.0 pg Final  . MCHC 01/15/2018 33.3  32.0 - 36.0 g/dL Final  . RDW 01/15/2018 13.7  11.5 - 14.5 % Final  . Platelets 01/15/2018 141* 150 - 440 K/uL Final  . Neutrophils Relative % 01/15/2018 49  % Final  . Neutro Abs 01/15/2018 1.9  1.4 - 6.5 K/uL Final  . Lymphocytes Relative 01/15/2018 34  % Final  . Lymphs Abs 01/15/2018 1.3  1.0 - 3.6 K/uL Final  . Monocytes Relative 01/15/2018 13  % Final  . Monocytes Absolute 01/15/2018 0.5  0.2 - 1.0 K/uL Final  . Eosinophils Relative 01/15/2018 3  % Final  . Eosinophils Absolute 01/15/2018 0.1  0 - 0.7 K/uL Final  . Basophils Relative 01/15/2018 1  % Final  . Basophils Absolute 01/15/2018 0.0  0 - 0.1 K/uL Final   Performed at [facility], [address] [place], [place] [address]  . HIV 1 Ab 01/15/2018 Negative  Negative Final  . HIV 2 Ab 01/15/2018 Negative  Negative Final  . Note 01/15/2018 Negative   Final   Comment: (NOTE) See RNA Reflex. Performed At: [facility] [place], [place] [id number] Rush Farmer MD MH:[id number]   . HIV 1 RNA  Qualitative 01/15/2018 1 RNA  Negative Final   Comment: Negative Negative for HIV   . Final Interpretation 01/15/2018 Comment   Final   Comment: (NOTE) HIV antibodies were not confirmed and HIV 1 RNA was not detected. No laboratory evidence of HIV 1 infection. Follow-up testing for HIV 2 should be performed if clinically indicated. Performed At: [facility] [address] [place], [place] [id number] Rush Farmer MD WK:[id number]     Assessment:  Jaime Blackburn is a 73 y.o. male with mild leukopenia and thrombocytopenia.  WBC has ranged between 3800-3900 since 2017.  Platelet count has ranged between 120,000 - 150,000.  He drinks alcohol daily (2 shots).  Work-up on 01/15/2018 revealed a hematocrit of 43.7, hemoglobin 14.6, MCV 94.4, platelets 141,000, WBC 3800 with an ANC of 1900.  Normal labs included:  B12 (705), folate (16.4), LDH (187), CMP, acute hepatitis panel, flow cytometry, HIV 1 and 2 antibody, and HIV1 RNA qualitative.  Peripheral smear revealed very mild thrombocytopenia without clumping.  RBCs were unremarkable. The WBC and differential were within normal limits.  There were no abnormal or immature cells.    Flow cytometry revealed no significant immunophenotypic abnormality.    Symptomatically, he denies any B symptoms.  He denies any new medications or herbal products.  Exam reveals no adenopathy or hepatosplenomegaly.  WBC is 3900.  Platelet count is 146,000.  Plan: 1.   Review interval labs.  2.   Labs today: CBC in blue top tube, lupus anticoagulant panel, HBV cAb total. PT, PTT. 3.   Mild leukopenia and thrombocytopenia  Review prior labs and work-up to date.  He has a very mild leukopenia and mild thrombocytopenia.  He denies any history of recurrent infections or excess bruising.  Discuss plan to r/o pseudothrombocytopenia and a lupus anticoagulant.  Prior testing  r/o acute hepatitis B.  Check hepatitis B core antibody to r/o prior infection. 4.  RTC in 1  week for review of testing.   Honor Loh, NP  06/04/2018, 8:52 AM   I saw and evaluated the patient, participating in the key portions of the service and reviewing pertinent diagnostic studies and records.  I reviewed the nurse practitioner's note and agree with the findings and the plan.  The assessment and plan were discussed with the patient.  Multiple questions were asked by the patient and answered.   Nolon Stalls, MD 06/04/2018,8:52 AM

## 2018-06-05 LAB — HEPATITIS B CORE ANTIBODY, TOTAL: Hep B Core Total Ab: NEGATIVE

## 2018-06-05 LAB — LUPUS ANTICOAGULANT PANEL
DRVVT: 41 s (ref 0.0–47.0)
PTT Lupus Anticoagulant: 31.5 s (ref 0.0–51.9)

## 2018-06-11 ENCOUNTER — Inpatient Hospital Stay (HOSPITAL_BASED_OUTPATIENT_CLINIC_OR_DEPARTMENT_OTHER): Payer: Medicare Other | Admitting: Hematology and Oncology

## 2018-06-11 VITALS — BP 150/81 | HR 70 | Temp 97.9°F | Resp 16 | Wt 189.3 lb

## 2018-06-11 DIAGNOSIS — D72819 Decreased white blood cell count, unspecified: Secondary | ICD-10-CM | POA: Diagnosis not present

## 2018-06-11 DIAGNOSIS — Z87891 Personal history of nicotine dependence: Secondary | ICD-10-CM | POA: Diagnosis not present

## 2018-06-11 DIAGNOSIS — Z85828 Personal history of other malignant neoplasm of skin: Secondary | ICD-10-CM

## 2018-06-11 DIAGNOSIS — D696 Thrombocytopenia, unspecified: Secondary | ICD-10-CM | POA: Diagnosis not present

## 2018-06-11 NOTE — Progress Notes (Signed)
Pt here for follow up. Denies any concerns at this time.  

## 2018-06-11 NOTE — Progress Notes (Signed)
[facility]  Clinic day:  06/11/2018  Chief Complaint: Jaime Blackburn is a 73 y.o. male with mild leukopenia and thrombocytopenia who is seen for 1 week assessment.  HPI:  The patient was last seen in the hematology clonic on 06/04/2018 for initial assessment.  Symptomatically, he denied any complaints.  Labs revealed a hematocrit of 44.7, hemoglobin 14.8, MCV 94.1, platelets 146,000, WBC 3900 with an ANC of 2000.  Differential was unremarkable.  Platelet count in a blue top tube was 135,000.  PT was 12.7 (INR 0.96).  PTT was 30.  Hepatitis B core antibody was negative.  Lupus anticoagulant testing was negative.  During the interim, he has done well.  He voices no complaints.    Past Medical History:  Diagnosis Date  . Arthritis     Past Surgical History:  Procedure Laterality Date  . FINGER AMPUTATION    . FOOT SURGERY      Family History  Problem Relation Age of Onset  . Colon cancer Mother   . Stroke Mother   . Tuberculosis Father   . Stroke Maternal Grandmother   . Lung cancer Paternal Grandfather     Social History:  reports that he quit smoking about 6 years ago. His smoking use included cigarettes. He has a 30.00 pack-year smoking history. He has never used smokeless tobacco. He reports current alcohol use of about 14.0 standard drinks of alcohol per week. He reports that he does not use drugs.  Patient is retired from the [employer] Therapist, music).  He was in [place], [place] and [place].  He had malaria in 1987.  Patient had several "exposures" to biologics/radiation/toxins while in the [employer]. The patient is alone today.  Allergies:  Allergies  Allergen Reactions  . Propoxyphene     Other reaction(s): Other (See Comments)    Current Medications: Current Outpatient Medications  Medication Sig Dispense Refill  . atorvastatin (LIPITOR) 40 MG tablet Take 40 mg by mouth daily at 6 PM.     . bimatoprost (LUMIGAN) 0.01 % SOLN Place 1 drop  into both eyes at bedtime.     . diclofenac sodium (VOLTAREN) 1 % GEL Apply 4 g topically 4 (four) times daily. 100 g 4  . fluticasone (FLONASE) 50 MCG/ACT nasal spray Place 1 spray into both nostrils as needed.     Marland Kitchen ibuprofen (ADVIL,MOTRIN) 200 MG tablet Take 200 mg by mouth at bedtime.     . Multiple Vitamins-Minerals (MULTIVITAMIN ADULT PO) Take 1 tablet by mouth daily.    Marland Kitchen nystatin ointment (MYCOSTATIN) Apply 1 application topically as needed.     . Omega 3 1200 MG CAPS Take 1 capsule by mouth daily.     . tamsulosin (FLOMAX) 0.4 MG CAPS capsule Take 0.4 mg by mouth as needed.      No current facility-administered medications for this visit.     Review of Systems:  GENERAL:  Feels good. No fevers, sweats or weight loss. PERFORMANCE STATUS (ECOG):  0 HEENT:  No visual changes, runny nose, sore throat, mouth sores or tenderness. Lungs: No shortness of breath or cough.  No hemoptysis. Cardiac:  No chest pain, palpitations, orthopnea, or PND. GI:  No nausea, vomiting, diarrhea, constipation, melena or hematochezia. GU:  No urgency, frequency, dysuria, or hematuria. Musculoskeletal:  No back pain.  Bilateral knee arthritis.  No muscle tenderness. Extremities:  No pain or swelling. Skin:  No rashes or skin changes. Neuro:  No headache, numbness or  weakness, balance or coordination issues. Endocrine:  No diabetes, thyroid issues, hot flashes or night sweats. Psych:  No mood changes, depression or anxiety. Pain:  No focal pain. Review of systems:  All other systems reviewed and found to be negative.  Physical Exam: Blood pressure (!) 150/81, pulse 70, temperature 97.9 F (36.6 C), temperature source Oral, resp. rate 16, weight 189 lb 4.2 oz (85.9 kg), SpO2 99 %. GENERAL:  Well developed, well nourished, gentleman sitting comfortably in the exam room in no acute distress. MENTAL STATUS:  Alert and oriented to person, place and time. HEAD:  Short gray hair.  Normocephalic, atraumatic,  face symmetric, no Cushingoid features. EYES:  Blue eyes.  No conjunctivitis or scleral icterus. NEUROLOGICAL: Unremarkable. PSYCH:  Appropriate. Marland Kitchen.   No visits with results within 3 Day(s) from this visit.  Latest known visit with results is:  Office Visit on 06/04/2018  Component Date Value Ref Range Status  . aPTT 06/04/2018 30  24 - 36 seconds Final   Performed at [facility], [address] [place], [place] [address]  . Prothrombin Time 06/04/2018 12.7  11.4 - 15.2 seconds Final  . INR 06/04/2018 0.96   Final   Performed at [facility], [address] [place], [place] [address]  . Hep B Core Total Ab 06/04/2018 Negative  Negative Final   Comment: (NOTE) Performed At: [facility] [place] [address] [place], [place] [id number] Jolene SchimkeNagendra Sanjai MD WG:[id number]:[phone]   . PTT Lupus Anticoagulant 06/04/2018 31.5  0.0 - 51.9 sec Final  . DRVVT 06/04/2018 41.0  0.0 - 47.0 sec Final  . Lupus Anticoag Interp 06/04/2018 Comment:   Corrected   Comment: (NOTE) No lupus anticoagulant was detected. Performed At: [facility]  [address] [place], [place] [id number] Jolene SchimkeNagendra Sanjai MD MW:[id number]:[phone]   . WBC 06/04/2018 3.9* 4.0 - 10.5 K/uL Final  . RBC 06/04/2018 4.75  4.22 - 5.81 MIL/uL Final  . Hemoglobin 06/04/2018 14.8  13.0 - 17.0 g/dL Final  . HCT 72/53/664402/13/2020 44.7  39.0 - 52.0 % Final  . MCV 06/04/2018 94.1  80.0 - 100.0 fL Final  . MCH 06/04/2018 31.2  26.0 - 34.0 pg Final  . MCHC 06/04/2018 33.1  30.0 - 36.0 g/dL Final  . RDW 03/47/425902/13/2020 12.5  11.5 - 15.5 % Final  . Platelets 06/04/2018 146* 150 - 400 K/uL Final   Comment: PLATELET COUNT CONFIRMED BY SMEAR CITRATE PLATELET COUNT 135   . nRBC 06/04/2018 0.0  0.0 - 0.2 % Final  . Neutrophils Relative % 06/04/2018 52  % Final  . Neutro Abs 06/04/2018 2.0  1.7 - 7.7 K/uL Final  . Lymphocytes Relative 06/04/2018 33  % Final  . Lymphs Abs 06/04/2018 1.3  0.7 - 4.0 K/uL Final  . Monocytes Relative  06/04/2018 12  % Final  . Monocytes Absolute 06/04/2018 0.5  0.1 - 1.0 K/uL Final  . Eosinophils Relative 06/04/2018 2  % Final  . Eosinophils Absolute 06/04/2018 0.1  0.0 - 0.5 K/uL Final  . Basophils Relative 06/04/2018 1  % Final  . Basophils Absolute 06/04/2018 0.1  0.0 - 0.1 K/uL Final  . Immature Granulocytes 06/04/2018 0  % Final  . Abs Immature Granulocytes 06/04/2018 0.01  0.00 - 0.07 K/uL Final   Performed at [facility], [address] [place], [place] [address]    Assessment:  Jaime Blackburn is a 73 y.o. male with mild leukopenia and thrombocytopenia.  WBC has ranged between 3800-3900  since 2017.  Platelet count has ranged between 120,000 - 150,000.  He drinks alcohol daily (2 shots).  He denies any new medications or herbal products.  Diet is good.  Work-up on 01/15/2018 revealed a hematocrit of 43.7, hemoglobin 14.6, MCV 94.4, platelets 141,000, WBC 3800 with an ANC of 1900.  Normal labs included:  B12 (705), folate (16.4), LDH (187), CMP, acute hepatitis panel, flow cytometry, HIV 1 and 2 antibody, and HIV1 RNA qualitative.  Peripheral smear revealed very mild thrombocytopenia without clumping.  RBCs were unremarkable. The WBC and differential were within normal limits.  There were no abnormal or immature cells.    Flow cytometry revealed no significant immunophenotypic abnormality.    Testing on 0213/2020 revealed a normal PT, PTT, lupus anticoagulant panel, and hepatitis B core antibody total.  Platelet count in a blue top tube was 135,000.   Symptomatically, he feels well.  He denies any B symptoms.  Plan: 1.   Review interval labs.  2.   Mild leukopenia and thrombocytopenia  Etiology unclear and possibly related to alcohol.  No medication or herbal product implicated.  No evidence of pseudothrombocytopenia.  Possible immune mediated thrombocytopenia (ITP) - diagnosis of exclusion.  Check copper level at next lab draw.  No clinical concern (infection or  excess bruising or bleeding).  Continue to monitor. 3.   RTC in 6 months for MD assessment and labs (CBC with diff, copper level).   Quentin Mulling, NP  06/11/2018, 9:55 AM   I saw and evaluated the patient, participating in the key portions of the service and reviewing pertinent diagnostic studies and records.  I reviewed the nurse practitioner's note and agree with the findings and the plan.  The assessment and plan were discussed with the patient.  A few questions were asked by the patient and answered.   Nelva Nay, MD 06/11/2018,9:55 AM

## 2018-09-27 ENCOUNTER — Encounter: Payer: Self-pay | Admitting: Hematology and Oncology

## 2019-01-01 NOTE — Progress Notes (Deleted)
Called patient no answer left message  

## 2019-01-04 ENCOUNTER — Encounter: Payer: Self-pay | Admitting: Hematology and Oncology

## 2019-01-04 ENCOUNTER — Inpatient Hospital Stay: Payer: Medicare Other

## 2019-01-04 ENCOUNTER — Inpatient Hospital Stay: Payer: Medicare Other | Admitting: Hematology and Oncology

## 2019-01-04 ENCOUNTER — Inpatient Hospital Stay (HOSPITAL_BASED_OUTPATIENT_CLINIC_OR_DEPARTMENT_OTHER): Payer: Medicare Other | Admitting: Hematology and Oncology

## 2019-01-04 ENCOUNTER — Other Ambulatory Visit: Payer: Self-pay

## 2019-01-04 ENCOUNTER — Inpatient Hospital Stay: Payer: Medicare Other | Attending: Hematology and Oncology

## 2019-01-04 VITALS — BP 147/83 | HR 72 | Temp 98.1°F | Resp 16 | Wt 185.2 lb

## 2019-01-04 DIAGNOSIS — D72819 Decreased white blood cell count, unspecified: Secondary | ICD-10-CM | POA: Insufficient documentation

## 2019-01-04 DIAGNOSIS — Z85828 Personal history of other malignant neoplasm of skin: Secondary | ICD-10-CM

## 2019-01-04 DIAGNOSIS — D696 Thrombocytopenia, unspecified: Secondary | ICD-10-CM | POA: Insufficient documentation

## 2019-01-04 DIAGNOSIS — Z79899 Other long term (current) drug therapy: Secondary | ICD-10-CM | POA: Insufficient documentation

## 2019-01-04 LAB — CBC WITH DIFFERENTIAL/PLATELET
Abs Immature Granulocytes: 0.01 10*3/uL (ref 0.00–0.07)
Basophils Absolute: 0 10*3/uL (ref 0.0–0.1)
Basophils Relative: 1 %
Eosinophils Absolute: 0.1 10*3/uL (ref 0.0–0.5)
Eosinophils Relative: 2 %
HCT: 43.3 % (ref 39.0–52.0)
Hemoglobin: 14.6 g/dL (ref 13.0–17.0)
Immature Granulocytes: 0 %
Lymphocytes Relative: 30 %
Lymphs Abs: 1.3 10*3/uL (ref 0.7–4.0)
MCH: 31.3 pg (ref 26.0–34.0)
MCHC: 33.7 g/dL (ref 30.0–36.0)
MCV: 92.7 fL (ref 80.0–100.0)
Monocytes Absolute: 0.5 10*3/uL (ref 0.1–1.0)
Monocytes Relative: 11 %
Neutro Abs: 2.4 10*3/uL (ref 1.7–7.7)
Neutrophils Relative %: 56 %
Platelets: 146 10*3/uL — ABNORMAL LOW (ref 150–400)
RBC: 4.67 MIL/uL (ref 4.22–5.81)
RDW: 12.7 % (ref 11.5–15.5)
WBC: 4.3 10*3/uL (ref 4.0–10.5)
nRBC: 0 % (ref 0.0–0.2)

## 2019-01-04 NOTE — Progress Notes (Signed)
[facility]  [address] [place], [place] [address] Phone: [phone]  Fax: [phone]   Clinic Day:  01/04/2019  Referring physician: Rolm Gala, MD  Chief Complaint: Jaime Blackburn is a 73 y.o. male with mild leukopenia and thrombocytopenia who is seen for 6 month assessment.  HPI: The patient was last seen in the hematology clinic on 06/11/2018. At that time, he felt well.  He denied any B symptoms.  CBC revealed a hematocrit of 44.7, hemoglobin 14.8, MCV 94.1, platelets 146,000, white count 3900 with an ANC of 2000.  Differential was unremarkable.  PT was 12.7 with an INR of 0.96.  PTT was 30.  Hepatitis B core antibody total was negative.  Lupus anticoagulant testing was negative  During the interim, he has been doing well. He recently had cataracts surgery in his left eye. He notes a Radio broadcast assistant.    Past Medical History:  Diagnosis Date   Arthritis     Past Surgical History:  Procedure Laterality Date   FINGER AMPUTATION     FOOT SURGERY      Family History  Problem Relation Age of Onset   Colon cancer Mother    Stroke Mother    Tuberculosis Father    Stroke Maternal Grandmother    Lung cancer Paternal Grandfather     Social History:  reports that he quit smoking about 6 years ago. His smoking use included cigarettes. He has a 30.00 pack-year smoking history. He has never used smokeless tobacco. He reports current alcohol use of about 14.0 standard drinks of alcohol per week. He reports that he does not use drugs. Patient is retired from the [employer] Therapist, music).  He was in [place], [place], and [place]. He had malaria in 1987.  Patient had several "exposures" to biologics/radiation/toxins while in the [employer]. The patient is alone today.  Allergies:  Allergies  Allergen Reactions   Propoxyphene     Other reaction(s): Other (See Comments)    Current Medications: Current Outpatient Medications  Medication Sig  Dispense Refill   atorvastatin (LIPITOR) 40 MG tablet Take 40 mg by mouth daily at 6 PM.      bimatoprost (LUMIGAN) 0.01 % SOLN Place 1 drop into both eyes at bedtime.      diclofenac sodium (VOLTAREN) 1 % GEL Apply 4 g topically 4 (four) times daily. 100 g 4   fluticasone (FLONASE) 50 MCG/ACT nasal spray Place 1 spray into both nostrils as needed.      ibuprofen (ADVIL,MOTRIN) 200 MG tablet Take 200 mg by mouth at bedtime.      Multiple Vitamins-Minerals (MULTIVITAMIN ADULT PO) Take 1 tablet by mouth daily.     nystatin ointment (MYCOSTATIN) Apply 1 application topically as needed.      Omega 3 1200 MG CAPS Take 1 capsule by mouth daily.      tamsulosin (FLOMAX) 0.4 MG CAPS capsule Take 0.4 mg by mouth as needed.      No current facility-administered medications for this visit.     Review of Systems  Constitutional: Positive for weight loss (4 pounds). Negative for chills, diaphoresis, fever and malaise/fatigue.       Feels "pretty good".  HENT: Negative.  Negative for congestion, ear pain, nosebleeds, sinus pain and sore throat.   Eyes: Negative for blurred vision, double vision and pain.       S/p cataract surgery.  Respiratory: Negative.  Negative for cough, sputum production and shortness of breath.  Cardiovascular: Negative.  Negative for chest pain, palpitations and leg swelling.  Gastrointestinal: Negative.  Negative for abdominal pain, blood in stool, constipation, diarrhea, melena, nausea and vomiting.  Genitourinary: Negative.  Negative for dysuria, frequency, hematuria and urgency.  Musculoskeletal: Negative.  Negative for back pain, joint pain and myalgias.  Skin: Negative.  Negative for rash.  Neurological: Negative.  Negative for dizziness, tingling, sensory change, speech change, focal weakness, weakness and headaches.  Endo/Heme/Allergies: Negative.  Does not bruise/bleed easily.  Psychiatric/Behavioral: Negative.  Negative for depression and memory loss. The  patient is not nervous/anxious and does not have insomnia.    Performance status (ECOG): 0  Vitals Blood pressure (!) 147/83, pulse 72, temperature 98.1 F (36.7 C), temperature source Oral, resp. rate 16, weight 185 lb 3 oz (84 kg), SpO2 100 %.   Physical Exam  Constitutional: He is oriented to person, place, and time. He appears well-developed and well-nourished. No distress. Face mask in place.  HENT:  Head: Normocephalic and atraumatic.  Right Ear: Hearing normal.  Left Ear: Hearing normal.  Nose: Nose normal.  Mouth/Throat: Oropharynx is clear and moist and mucous membranes are normal. No oral lesions.  Gray hair.  Mask.  Eyes: Pupils are equal, round, and reactive to light. Conjunctivae and EOM are normal. No scleral icterus.  Blue eyes s/p cataract surgery.  Neck: Normal range of motion. No JVD present.  Cardiovascular: Normal rate, regular rhythm and normal heart sounds. Exam reveals no gallop and no friction rub.  No murmur heard. Pulmonary/Chest: Effort normal and breath sounds normal. He has no wheezes. He has no rhonchi. He has no rales. He exhibits no mass, no tenderness and no edema.  Abdominal: Soft. Normal appearance and bowel sounds are normal. He exhibits no mass. There is no hepatosplenomegaly. There is no abdominal tenderness. There is no rebound, no guarding and no CVA tenderness.  Musculoskeletal: Normal range of motion.        General: No edema.  Lymphadenopathy:    He has no cervical adenopathy.       Right cervical: No superficial cervical adenopathy present.   He has no axillary adenopathy.       Right: No inguinal adenopathy present.       Left: No inguinal adenopathy present.  Neurological: He is alert and oriented to person, place, and time.  Skin: Skin is dry and intact. No bruising, no lesion and no rash noted. He is not diaphoretic. No erythema. No pallor.  Psychiatric: He has a normal mood and affect. His behavior is normal. Judgment and thought  content normal.  Nursing note and vitals reviewed.   Appointment on 01/04/2019  Component Date Value Ref Range Status   Copper 01/04/2019 91  72 - 166 ug/dL Final   Comment: (NOTE) This test was developed and its performance characteristics determined by [facility]. It has not been cleared or approved by the Food and Drug Administration.                                Detection Limit = 5 Performed At: [facility] [place], [place] [id number] Rush Farmer MD IR:[id number]    WBC 01/04/2019 4.3  4.0 - 10.5 K/uL Final   RBC 01/04/2019 4.67  4.22 - 5.81 MIL/uL Final   Hemoglobin 01/04/2019 14.6  13.0 - 17.0 g/dL Final   HCT 01/04/2019 43.3  39.0 - 52.0 % Final   MCV 01/04/2019  92.7  80.0 - 100.0 fL Final   MCH 01/04/2019 31.3  26.0 - 34.0 pg Final   MCHC 01/04/2019 33.7  30.0 - 36.0 g/dL Final   RDW 91/47/829509/14/2020 12.7  11.5 - 15.5 % Final   Platelets 01/04/2019 146* 150 - 400 K/uL Final   nRBC 01/04/2019 0.0  0.0 - 0.2 % Final   Neutrophils Relative % 01/04/2019 56  % Final   Neutro Abs 01/04/2019 2.4  1.7 - 7.7 K/uL Final   Lymphocytes Relative 01/04/2019 30  % Final   Lymphs Abs 01/04/2019 1.3  0.7 - 4.0 K/uL Final   Monocytes Relative 01/04/2019 11  % Final   Monocytes Absolute 01/04/2019 0.5  0.1 - 1.0 K/uL Final   Eosinophils Relative 01/04/2019 2  % Final   Eosinophils Absolute 01/04/2019 0.1  0.0 - 0.5 K/uL Final   Basophils Relative 01/04/2019 1  % Final   Basophils Absolute 01/04/2019 0.0  0.0 - 0.1 K/uL Final   Immature Granulocytes 01/04/2019 0  % Final   Abs Immature Granulocytes 01/04/2019 0.01  0.00 - 0.07 K/uL Final   Performed at [facility], [address] [place], [place] [address]    Assessment:  Jaime Blackburn is a 73 y.o. male with mild leukopenia and thrombocytopenia.  WBC has ranged between 3800-3900 since 2017. Platelet count has ranged between 120,000 - 150,000. He drinks alcohol daily (2 shots).   He denies any new medications or herbal products.  Diet is good.  Work-up on 09/26/2019revealed a hematocrit of 43.7, hemoglobin 14.6, MCV 94.4, platelets 141,000, WBC 3800 with an ANC of 1900. Normal labsincluded: B12 (705), folate (16.4), LDH (187), CMP, acute hepatitis panel, flow cytometry, HIV 1 and 2 antibody, and HIV1 RNA qualitative. Peripheral smearrevealed very mild thrombocytopenia without clumping. RBCs wereunremarkable. The WBC and differential werewithin normal limits. There were no abnormal or immature cells.Flow cytometry revealed no significant immunophenotypic abnormality.  Testing on 06/04/2018 revealed a normal PT, PTT, lupus anticoagulant panel, and hepatitis B core antibody total.  Platelet count in a blue top tube was 135,000.  Copper level was 91 (normal) on 01/04/2019.  Symptomatically, he is doing well.  Exam reveals to adenopathy or hepatosplenomegaly.  Plan: 1.   Labs today:  CBC with diff, copper level.  2.   Mild leukopenia and thrombocytopenia             WBC 4300.  ANC 2400.  Platelets 146,000.  WBC and ANC are normal.  Platelet count is just below 150,000.  Copper level today is normal.  Continue to monitor. 3.   RTC prn.  I discussed the assessment and treatment plan with the patient.  The patient was provided an opportunity to ask questions and all were answered.  The patient agreed with the plan and demonstrated an understanding of the instructions.  The patient was advised to call back if the symptoms worsen or if the condition fails to improve as anticipated.   Rosey BathMelissa C Guy Toney, MD, PhD    01/04/2019, 2:47 PM  I, Mal MistyAmber Handy, am acting as Neurosurgeonscribe for General MotorsMelissa C. Merlene Pullingorcoran, MD, PhD.  I, Christina Waldrop C. Merlene Pullingorcoran, MD, have reviewed the above documentation for accuracy and completeness, and I agree with the above.

## 2019-01-04 NOTE — Progress Notes (Signed)
Patient here for follow up. Denies any concerns.  

## 2019-01-07 LAB — COPPER, SERUM: Copper: 91 ug/dL (ref 72–166)

## 2020-12-22 ENCOUNTER — Other Ambulatory Visit: Payer: Self-pay | Admitting: *Deleted

## 2020-12-22 DIAGNOSIS — Z87891 Personal history of nicotine dependence: Secondary | ICD-10-CM

## 2021-01-10 ENCOUNTER — Ambulatory Visit
Admission: RE | Admit: 2021-01-10 | Discharge: 2021-01-10 | Disposition: A | Discharge status: Home / Self Care | Payer: Medicare Other | Source: Ambulatory Visit | Attending: Acute Care | Admitting: Acute Care

## 2021-01-10 ENCOUNTER — Telehealth (INDEPENDENT_AMBULATORY_CARE_PROVIDER_SITE_OTHER): Payer: Medicare Other | Admitting: Acute Care

## 2021-01-10 ENCOUNTER — Other Ambulatory Visit: Payer: Self-pay

## 2021-01-10 ENCOUNTER — Encounter: Payer: Self-pay | Admitting: Acute Care

## 2021-01-10 DIAGNOSIS — Z87891 Personal history of nicotine dependence: Secondary | ICD-10-CM | POA: Diagnosis not present

## 2021-01-10 NOTE — Progress Notes (Signed)
Virtual Visit via Video Note  I connected with Jaime Blackburn on 01/10/21 at 11:30 AM EDT by a video enabled telemedicine application and verified that I am speaking with the correct person using two identifiers.  Location: Patient: At home Provider: [address] [place], [place], [address]    I discussed the limitations of evaluation and management by telemedicine and the availability of in person appointments. The patient expressed understanding and agreed to proceed.   Shared Decision Making Visit Lung Cancer Screening Program [phone])   Eligibility: Age 75 y.o. Pack Years Smoking History Calculation 44 pack year smoking history (# packs/per year x # years smoked) Recent History of coughing up blood  no Unexplained weight loss? no ( >Than 15 pounds within the last 6 months ) Prior History Lung / other cancer no (Diagnosis within the last 5 years already requiring surveillance chest CT Scans). Smoking Status Former Smoker Former Smokers: Years since quit: 10 years  Quit Date: 2012  Visit Components: Discussion included one or more decision making aids. yes Discussion included risk/benefits of screening. yes Discussion included potential follow up diagnostic testing for abnormal scans. yes Discussion included meaning and risk of over diagnosis. yes Discussion included meaning and risk of False Positives. yes Discussion included meaning of total radiation exposure. yes  Counseling Included: Importance of adherence to annual lung cancer LDCT screening. yes Impact of comorbidities on ability to participate in the program. yes Ability and willingness to under diagnostic treatment. yes  Smoking Cessation Counseling: Current Smokers:  Discussed importance of smoking cessation. yes Information about tobacco cessation classes and interventions provided to patient. yes Patient provided with "ticket" for LDCT Scan. yes Symptomatic Patient. no  Counseling Diagnosis Code:  Tobacco Use Z72.0 Asymptomatic Patient yes  Counseling (Intermediate counseling: > three minutes counseling) B7169 Former Smokers:  Discussed the importance of maintaining cigarette abstinence. yes Diagnosis Code: Personal History of Nicotine Dependence. C78.938 Information about tobacco cessation classes and interventions provided to patient. Yes Patient provided with "ticket" for LDCT Scan. yes Written Order for Lung Cancer Screening with LDCT placed in Epic. Yes (CT Chest Lung Cancer Screening Low Dose W/O CM) [id number] Z12.2-Screening of respiratory organs Z87.891-Personal history of nicotine dependence   I spent 25 minutes of face to face time with Jaime Blackburn discussing the risks and benefits of lung cancer screening. We viewed a power point together that explained in detail the above noted topics. We took the time to pause the power point at intervals to allow for questions to be asked and answered to ensure understanding. We discussed that he had taken the single most powerful action possible to decrease his risk of developing lung cancer when he quit smoking. I counseled him to remain smoke free, and to contact me if he ever had the desire to smoke again so that I can provide resources and tools to help support the effort to remain smoke free. We discussed the time and location of the scan, and that either  Abigail Miyamoto RN or I will call with the results within  24-48 hours of receiving them. He has my card and contact information in the event he needs to speak with me, in addition to a copy of the power point we reviewed as a resource. He verbalized understanding of all of the above and had no further questions upon leaving the office.     I explained to the patient that there has been a high incidence of coronary artery disease noted on  these exams. I explained that this is a non-gated exam therefore degree or severity cannot be determined. This patient is on statin therapy. I have asked  the patient to follow-up with their PCP regarding any incidental finding of coronary artery disease and management with diet or medication as they feel is clinically indicated. The patient verbalized understanding of the above and had no further questions.    Bevelyn Ngo, NP 01/10/2021

## 2021-01-10 NOTE — Patient Instructions (Signed)

## 2021-02-08 ENCOUNTER — Telehealth: Payer: Self-pay | Admitting: Acute Care

## 2021-02-08 DIAGNOSIS — Z87891 Personal history of nicotine dependence: Secondary | ICD-10-CM

## 2021-02-08 NOTE — Telephone Encounter (Signed)
Pt informed of CT results per Kandice Robinsons, NP.  PT verbalized understanding.  Copy of CT sent to PCP.  Order placed for 1 yr f/u CT. Pt is aware of the thoracic aneurysm from previous studies through the [place]. Pt does not want a referral to thoracic surgery at this time. [facility].

## 2021-02-08 NOTE — Addendum Note (Signed)
Addended by: Abigail Miyamoto D on: 02/08/2021 02:58 PM   Modules accepted: Orders

## 2021-02-08 NOTE — Telephone Encounter (Signed)
Please call patient and let them  know their  low dose Ct was read as a Lung RADS 2: nodules that are benign in appearance and behavior with a very low likelihood of becoming a clinically active cancer due to size or lack of growth. Recommendation per radiology is for a repeat LDCT in 12 months. .Please let them  know we will order and schedule their  annual screening scan for . Please let them  know there was notation of CAD on their  scan.  Please remind the patient  that this is a non-gated exam therefore degree or severity of disease  cannot be determined. Please have them  follow up with their PCP regarding potential risk factor modification, dietary therapy or pharmacologic therapy if clinically indicated. Pt.  is  currently on statin therapy. Please place order for annual  screening scan for  12/2020 and fax results to PCP. Thanks so much.   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 Recommend semi-annual imaging followup by CTA or MRA and referral to cardiothoracic surgery if not already obtained. Please let him know BP management is important.   Denise, please send referral to thoracic surgery if he is not already followed by them. Annual lung cancer screening can be one of his bi annual scan each year until he ages out of the program in 3 years. Thanks so much

## 2021-11-09 ENCOUNTER — Ambulatory Visit
Admission: EM | Admit: 2021-11-09 | Discharge: 2021-11-09 | Disposition: A | Discharge status: Home / Self Care | Payer: Medicare Other

## 2021-11-09 ENCOUNTER — Encounter: Payer: Self-pay | Admitting: Emergency Medicine

## 2021-11-09 DIAGNOSIS — H8113 Benign paroxysmal vertigo, bilateral: Secondary | ICD-10-CM | POA: Diagnosis not present

## 2021-11-09 DIAGNOSIS — R110 Nausea: Secondary | ICD-10-CM

## 2021-11-09 MED ORDER — ONDANSETRON 4 MG PO TBDP: 4.0000 mg | ORAL_TABLET | Freq: Three times a day (TID) | ORAL | 0 refills | Status: AC | PRN

## 2021-11-09 MED ORDER — MECLIZINE HCL 25 MG PO TABS: 25.0000 mg | ORAL_TABLET | Freq: Three times a day (TID) | ORAL | 0 refills | Status: DC | PRN

## 2021-11-09 MED ORDER — ONDANSETRON 4 MG PO TBDP: 4.0000 mg | ORAL_TABLET | Freq: Three times a day (TID) | ORAL | 0 refills | Status: DC | PRN

## 2021-11-09 NOTE — ED Triage Notes (Signed)
Patient c/o dizziness and nausea that started 3 days ago.  Patient denies cold symptoms or fevers.  Patient denies ear problems.  Patient states that standing or moving his head makes his dizziness worse.

## 2021-11-09 NOTE — Discharge Instructions (Signed)
-  Your blood pressure is a tad high so continue your blood pressure medicine. - We discussed that your exam is reassuring overall and so are your vital signs. - If you develop any worsening symptoms including if you start to have a severe headache, vision changes, passout, fall, chest pain, palpitations, breathing difficulty, abdominal pain, vomiting, numbness/tingling or weakness in extremities, significant neck pain or back pain or are feeling generally worse or not better you need to be seen again.  Go to ER for any of these red flag signs and symptoms, otherwise if the medicine is not helping over the next week, please follow-up with your primary care provider.

## 2021-11-09 NOTE — ED Provider Notes (Signed)
[facility]    CSN: [id number] Arrival date & time: 11/09/21  0915      History   Chief Complaint Chief Complaint  Patient presents with   Dizziness    HPI VIOLA PLACERES is a 76 y.o. male presenting for 2-day history of feeling dizzy, lightheaded and nauseous.  Patient reports he has increased dizziness when he turns his head and also when he walks.  Nothing really helps his symptoms.  He has not tried any over-the-counter medicines.  He denies any associated headaches, vision changes, facial drooping, speech or balance problems, numbness/tingling or weakness in extremities, chest pain, potation's, shortness of breath, abdominal pain, vomiting.  Patient also has not had any neck pain or stiffness, fevers, cough, congestion, ear pain or pressure, hearing loss.  He reports he thinks something bit or stung him on his right forearm the day before onset of symptoms.  He did not see what it was but had a small blister that he picked.  He now has a bruise in that area.  He also reports getting a tooth pulled a day or 2 before onset of symptoms but says he has no pain in this area now and no problems eating.  Normal appetite.  Medical history significant for hyperlipidemia and hypertension.  Patient denies any history of cardiopulmonary disease.  No history of heart attack or stroke.  No history of any symptoms like this in the past.  He did have routine lab work performed by his primary care provider this week.  He has no other complaints.  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 Authorizing Provider  atorvastatin (LIPITOR) 40 MG tablet Take 40 mg by mouth daily at 6 PM.  07/27/15  Yes [provider]  lisinopril (ZESTRIL) 20 MG tablet Take by mouth. 05/04/21  Yes [provider]  Multiple Vitamins-Minerals (MULTIVITAMIN ADULT PO) Take 1 tablet by mouth daily.   Yes [provider]  TRAMADOL HCL PO Take by mouth.   Yes [provider]  bimatoprost (LUMIGAN) 0.01 % SOLN Place 1 drop into both eyes at bedtime.     [provider]  diclofenac sodium (VOLTAREN) 1 % GEL Apply 4 g topically 4 (four) times daily. 10/25/15   Hyatt, Max T, DPM  fluticasone (FLONASE) 50 MCG/ACT nasal spray Place 1 spray into both nostrils as needed.  10/07/15   [provider]  ibuprofen (ADVIL,MOTRIN) 200 MG tablet Take 200 mg by mouth at bedtime.     [provider]  meclizine (ANTIVERT) 25 MG tablet Take 1 tablet (25 mg total) by mouth 3 (three) times daily as needed for dizziness. 11/09/21   Eusebio Friendly B, PA-C  nystatin ointment (MYCOSTATIN) Apply  1 application topically as needed.  11/03/14   [provider]  Omega 3 1200 MG CAPS Take 1 capsule by mouth daily.  07/17/10   [provider]  ondansetron (ZOFRAN-ODT) 4 MG disintegrating tablet Take 1 tablet (4 mg total) by mouth every 8 (eight) hours as needed for nausea or vomiting. 11/09/21   Eusebio Friendly B, PA-C  tamsulosin (FLOMAX) 0.4 MG CAPS capsule Take 0.4 mg by mouth as needed.  09/28/15   [provider]    Family History Family History  Problem Relation Age of Onset   Colon cancer Mother    Stroke Mother     Tuberculosis Father    Stroke Maternal Grandmother    Lung cancer Paternal Grandfather     Social History Social History   Tobacco Use   Smoking status: Former    Packs/day: 1.00    Years: 30.00    Total pack years: 30.00    Types: Cigarettes    Quit date: 01/16/2012    Years since quitting: 9.8   Smokeless tobacco: Never  Vaping Use   Vaping Use: Never used  Substance Use Topics   Alcohol use: Yes    Alcohol/week: 14.0 standard drinks of alcohol    Types: 14 Shots of liquor per week    Comment: 2 shots daily    Drug use: Never     Allergies   Propoxyphene   Review of Systems Review of Systems  Constitutional:  Negative for appetite change, fatigue and fever.  HENT:  Negative for congestion, ear discharge, ear pain and hearing loss.   Eyes:  Negative for photophobia and visual disturbance.  Respiratory:  Negative for shortness of breath and wheezing.   Cardiovascular:  Negative for chest pain, palpitations and leg swelling.  Gastrointestinal:  Positive for nausea. Negative for abdominal pain and vomiting.  Musculoskeletal:  Negative for arthralgias, gait problem, myalgias, neck pain and neck stiffness.  Neurological:  Positive for dizziness and light-headedness. Negative for tremors, seizures, syncope, facial asymmetry, speech difficulty, weakness, numbness and headaches.  Psychiatric/Behavioral:  Negative for confusion.      Physical Exam Triage Vital Signs ED Triage Vitals  Enc Vitals Group     BP      Pulse      Resp      Temp      Temp src      SpO2      Weight      Height      Head Circumference      Peak Flow      Pain Score      Pain Loc      Pain Edu?      Excl. in [place]?    No data found.  Updated Vital Signs BP (!) 156/84 (BP Location: Left Arm)   Pulse 75   Temp 98.2 F (36.8 C) (Oral)   Resp 14   Ht 5\' 11"  (1.803 m)   Wt 173 lb (78.5 kg)   SpO2 100%   BMI 24.13 kg/m      Physical Exam Vitals and nursing note reviewed.   Constitutional:      General: He is not in acute distress.    Appearance: Normal appearance. He is well-developed. He is not ill-appearing.  HENT:     Head: Normocephalic and atraumatic.     Right Ear: Tympanic membrane, ear canal and external ear normal.     Left Ear: Tympanic membrane, ear canal and external ear normal.  Nose: Nose normal.     Mouth/Throat:     Mouth: Mucous membranes are moist.     Pharynx: Oropharynx is clear.     Comments: No dental abscesses or tenderness Eyes:     General: No scleral icterus.    Extraocular Movements: Extraocular movements intact.     Conjunctiva/sclera: Conjunctivae normal.     Pupils: Pupils are equal, round, and reactive to light.  Cardiovascular:     Rate and Rhythm: Normal rate and regular rhythm.     Heart sounds: Normal heart sounds.  Pulmonary:     Effort: Pulmonary effort is normal. No respiratory distress.     Breath sounds: Normal breath sounds.  Abdominal:     Palpations: Abdomen is soft.     Tenderness: There is no abdominal tenderness.  Musculoskeletal:     Cervical back: Neck supple.  Skin:    General: Skin is warm and dry.     Capillary Refill: Capillary refill takes less than 2 seconds.     Comments: Contusion of right dorsal forearm.  No induration, erythema or signs of infection.  Neurological:     General: No focal deficit present.     Mental Status: He is alert and oriented to person, place, and time. Mental status is at baseline.     Cranial Nerves: No cranial nerve deficit.     Motor: No weakness.     Coordination: Coordination normal.     Gait: Gait normal.     Comments: Reports dizziness when turning his head while laying down and then when asked to sit up  5/5 strength bilateral upper and lower extremities  Psychiatric:        Mood and Affect: Mood normal.        Behavior: Behavior normal.      UC Treatments / Results  Labs (all labs ordered are listed, but only abnormal results are  displayed) Labs Reviewed - No data to display  EKG   Radiology No results found.  Procedures Procedures (including critical care time)  Medications Ordered in UC Medications - No data to display  Initial Impression / Assessment and Plan / UC Course  I have reviewed the triage vital signs and the nursing notes.  Pertinent labs & imaging results that were available during my care of the patient were reviewed by me and considered in my medical decision making (see chart for details).  76 year old male presenting for dizziness, lightheadedness and nausea without vomiting for the past 2 days.  No associated headaches, neck pain, vision changes, speech problems, balance difficulty, numbness/tingling or weakness, chest pain, palpitations, shortness of breath.  No recent illnesses.  No ear pain or hearing loss.  No history of similar symptoms.  Blood pressure elevated at 156/84.  He does report taking his blood pressure medicine.  History of hypertension.  Other vitals normal and stable.  He is overall well-appearing.  Patient has complaints of feeling dizzy when laying flat on his back and turning his head side to side and when sitting up from a laying down position.  Normal gait.  Normal neurological exam.  5 out of 5 strength bilateral upper and lower extremities.  Chest clear to auscultation heart regular rate and rhythm.  Abdomen soft and nontender.  Examination of skin shows a small bruise on his right forearm.  Not consistent with any infection.  Examination of his oral cavity does not reveal any obvious infections.  Suspect patient symptoms are likely related to benign positional  vertigo.  His vitals are stable and his exam is overall very reassuring.  I did review all red flag signs and symptoms with patient and advised him to monitor his condition closely and follow-up with his primary care provider next week.  I did send meclizine and Zofran to pharmacy and advised him to increase his  fluid intake and rest.  Be careful position changes.  Again, reviewed that if he has any of the red flag signs and symptoms which we discussed he should call 911 or have someone take him to the emergency department.   Final Clinical Impressions(s) / UC Diagnoses   Final diagnoses:  Benign paroxysmal positional vertigo due to bilateral vestibular disorder  Nausea without vomiting     Discharge Instructions      -Your blood pressure is a tad high so continue your blood pressure medicine. - We discussed that your exam is reassuring overall and so are your vital signs. - If you develop any worsening symptoms including if you start to have a severe headache, vision changes, passout, fall, chest pain, palpitations, breathing difficulty, abdominal pain, vomiting, numbness/tingling or weakness in extremities, significant neck pain or back pain or are feeling generally worse or not better you need to be seen again.  Go to ER for any of these red flag signs and symptoms, otherwise if the medicine is not helping over the next week, please follow-up with your primary care provider.     ED Prescriptions     Medication Sig Dispense Auth. Provider   meclizine (ANTIVERT) 25 MG tablet  (Status: Discontinued) Take 1 tablet (25 mg total) by mouth 3 (three) times daily as needed for dizziness. 30 tablet Eusebio Friendly B, PA-C   ondansetron (ZOFRAN-ODT) 4 MG disintegrating tablet  (Status: Discontinued) Take 1 tablet (4 mg total) by mouth every 8 (eight) hours as needed for nausea or vomiting. 20 tablet Shirlee Latch, PA-C   meclizine (ANTIVERT) 25 MG tablet Take 1 tablet (25 mg total) by mouth 3 (three) times daily as needed for dizziness. 30 tablet Eusebio Friendly B, PA-C   ondansetron (ZOFRAN-ODT) 4 MG disintegrating tablet Take 1 tablet (4 mg total) by mouth every 8 (eight) hours as needed for nausea or vomiting. 20 tablet Gareth Morgan      PDMP not reviewed this encounter.   Shirlee Latch,  PA-C 11/09/21 1102

## 2021-11-19 ENCOUNTER — Other Ambulatory Visit: Payer: Self-pay | Admitting: Physician Assistant

## 2021-11-19 DIAGNOSIS — R42 Dizziness and giddiness: Secondary | ICD-10-CM

## 2021-11-19 DIAGNOSIS — R519 Headache, unspecified: Secondary | ICD-10-CM

## 2021-11-19 DIAGNOSIS — R413 Other amnesia: Secondary | ICD-10-CM

## 2021-11-21 ENCOUNTER — Ambulatory Visit
Admission: RE | Admit: 2021-11-21 | Discharge: 2021-11-21 | Disposition: A | Discharge status: Home / Self Care | Payer: Medicare Other | Source: Ambulatory Visit | Attending: Physician Assistant | Admitting: Physician Assistant

## 2021-11-21 DIAGNOSIS — R42 Dizziness and giddiness: Secondary | ICD-10-CM | POA: Diagnosis not present

## 2021-11-21 DIAGNOSIS — R519 Headache, unspecified: Secondary | ICD-10-CM | POA: Diagnosis present

## 2021-11-21 DIAGNOSIS — R413 Other amnesia: Secondary | ICD-10-CM | POA: Diagnosis present

## 2021-11-21 MED ORDER — IOHEXOL 300 MG/ML  SOLN
75.0000 mL | Freq: Once | INTRAMUSCULAR | Status: AC | PRN
  Administered 2021-11-21: 75 mL via INTRAVENOUS

## 2022-01-10 ENCOUNTER — Ambulatory Visit
Admission: RE | Admit: 2022-01-10 | Discharge: 2022-01-10 | Disposition: A | Discharge status: Home / Self Care | Payer: Medicare Other | Source: Ambulatory Visit | Attending: Acute Care | Admitting: Acute Care

## 2022-01-10 DIAGNOSIS — I700 Atherosclerosis of aorta: Secondary | ICD-10-CM | POA: Diagnosis not present

## 2022-01-10 DIAGNOSIS — Z122 Encounter for screening for malignant neoplasm of respiratory organs: Secondary | ICD-10-CM | POA: Diagnosis not present

## 2022-01-10 DIAGNOSIS — Z87891 Personal history of nicotine dependence: Secondary | ICD-10-CM | POA: Diagnosis not present

## 2022-01-10 DIAGNOSIS — J439 Emphysema, unspecified: Secondary | ICD-10-CM | POA: Diagnosis not present

## 2022-01-10 DIAGNOSIS — I2510 Atherosclerotic heart disease of native coronary artery without angina pectoris: Secondary | ICD-10-CM | POA: Insufficient documentation

## 2022-01-14 ENCOUNTER — Other Ambulatory Visit: Payer: Self-pay | Admitting: Acute Care

## 2022-01-14 DIAGNOSIS — Z87891 Personal history of nicotine dependence: Secondary | ICD-10-CM

## 2022-01-14 DIAGNOSIS — Z122 Encounter for screening for malignant neoplasm of respiratory organs: Secondary | ICD-10-CM

## 2022-05-16 ENCOUNTER — Other Ambulatory Visit: Payer: Self-pay | Admitting: Internal Medicine

## 2022-05-16 DIAGNOSIS — I2510 Atherosclerotic heart disease of native coronary artery without angina pectoris: Secondary | ICD-10-CM

## 2022-05-17 ENCOUNTER — Other Ambulatory Visit: Payer: Self-pay | Admitting: Internal Medicine

## 2022-05-17 DIAGNOSIS — Z136 Encounter for screening for cardiovascular disorders: Secondary | ICD-10-CM

## 2022-05-17 DIAGNOSIS — I2510 Atherosclerotic heart disease of native coronary artery without angina pectoris: Secondary | ICD-10-CM

## 2022-05-21 ENCOUNTER — Other Ambulatory Visit: Payer: Self-pay | Admitting: Internal Medicine

## 2022-05-21 ENCOUNTER — Ambulatory Visit
Admission: RE | Admit: 2022-05-21 | Discharge: 2022-05-21 | Disposition: A | Discharge status: Home / Self Care | Payer: Medicare Other | Source: Ambulatory Visit | Attending: Internal Medicine | Admitting: Internal Medicine

## 2022-05-21 ENCOUNTER — Ambulatory Visit
Admission: RE | Admit: 2022-05-21 | Discharge: 2022-05-21 | Disposition: A | Discharge status: Home / Self Care | Payer: Medicare Other | Attending: Internal Medicine | Admitting: Internal Medicine

## 2022-05-21 ENCOUNTER — Telehealth (HOSPITAL_COMMUNITY): Payer: Self-pay | Admitting: *Deleted

## 2022-05-21 DIAGNOSIS — R61 Generalized hyperhidrosis: Secondary | ICD-10-CM

## 2022-05-21 DIAGNOSIS — D649 Anemia, unspecified: Secondary | ICD-10-CM

## 2022-05-21 DIAGNOSIS — R7982 Elevated C-reactive protein (CRP): Secondary | ICD-10-CM

## 2022-05-21 DIAGNOSIS — R768 Other specified abnormal immunological findings in serum: Secondary | ICD-10-CM

## 2022-05-21 DIAGNOSIS — J40 Bronchitis, not specified as acute or chronic: Secondary | ICD-10-CM

## 2022-05-21 DIAGNOSIS — D72829 Elevated white blood cell count, unspecified: Secondary | ICD-10-CM

## 2022-05-21 MED ORDER — METOPROLOL TARTRATE 100 MG PO TABS: ORAL_TABLET | ORAL | 0 refills | Status: DC

## 2022-05-21 MED ORDER — IVABRADINE HCL 7.5 MG PO TABS: ORAL_TABLET | ORAL | 0 refills | Status: DC

## 2022-05-21 NOTE — Telephone Encounter (Signed)
Reaching out to patient to offer assistance regarding upcoming cardiac imaging study; pt verbalizes understanding of appt date/time, parking situation and where to check in, pre-test NPO status and medications ordered, and verified current allergies; name and call back number provided for further questions should they arise  Katyra Tomassetti RN Navigator Cardiac Imaging [facility] [phone] office [phone] cell  Patient to take 100mg metoprolol tartrate and 15mg ivabradine two hours prior to his cardiac CT scan. 

## 2022-05-22 ENCOUNTER — Other Ambulatory Visit (HOSPITAL_COMMUNITY): Payer: Self-pay

## 2022-05-22 ENCOUNTER — Ambulatory Visit: Admission: RE | Admit: 2022-05-22 | Payer: Medicare Other | Source: Ambulatory Visit

## 2022-05-23 ENCOUNTER — Ambulatory Visit
Admission: RE | Admit: 2022-05-23 | Discharge: 2022-05-23 | Disposition: A | Discharge status: Home / Self Care | Payer: Medicare Other | Source: Ambulatory Visit | Attending: Internal Medicine | Admitting: Internal Medicine

## 2022-05-23 DIAGNOSIS — R768 Other specified abnormal immunological findings in serum: Secondary | ICD-10-CM | POA: Diagnosis present

## 2022-05-23 DIAGNOSIS — D649 Anemia, unspecified: Secondary | ICD-10-CM | POA: Diagnosis present

## 2022-05-23 DIAGNOSIS — R61 Generalized hyperhidrosis: Secondary | ICD-10-CM

## 2022-05-23 DIAGNOSIS — J40 Bronchitis, not specified as acute or chronic: Secondary | ICD-10-CM | POA: Insufficient documentation

## 2022-05-23 DIAGNOSIS — I2510 Atherosclerotic heart disease of native coronary artery without angina pectoris: Secondary | ICD-10-CM

## 2022-05-23 DIAGNOSIS — D72829 Elevated white blood cell count, unspecified: Secondary | ICD-10-CM | POA: Insufficient documentation

## 2022-05-23 DIAGNOSIS — R7982 Elevated C-reactive protein (CRP): Secondary | ICD-10-CM | POA: Insufficient documentation

## 2022-05-23 MED ORDER — METOPROLOL TARTRATE 5 MG/5ML IV SOLN: 10.0000 mg | Freq: Once | INTRAVENOUS | Status: DC

## 2022-05-23 MED ORDER — NITROGLYCERIN 0.4 MG SL SUBL
0.8000 mg | SUBLINGUAL_TABLET | Freq: Once | SUBLINGUAL | Status: AC
  Administered 2022-05-23: 0.8 mg via SUBLINGUAL

## 2022-05-23 MED ORDER — IOHEXOL 350 MG/ML SOLN
75.0000 mL | Freq: Once | INTRAVENOUS | Status: AC | PRN
  Administered 2022-05-23: 75 mL via INTRAVENOUS

## 2022-05-23 NOTE — Progress Notes (Signed)
Patient tolerated procedure well. Ambulate w/o difficulty. Denies light headedness or being dizzy. Sitting in chair drinking water provided. Encouraged to drink extra water today and reasoning explained. Verbalized understanding. All questions answered. ABC intact. No further needs. Discharge from procedure area w/o issues.   °

## 2022-05-24 IMAGING — CT CT CHEST LUNG CANCER SCREENING LOW DOSE W/O CM
2 of 5 series · 15 of 40 positions shown, 18 images · non-contrast
Comparison: 10/13/2014 chest CT.

CLINICAL DATA: 75-year-old asymptomatic male former smoker with 44
pack-year smoking history, quit smoking in 4404.

EXAM:
CT CHEST WITHOUT CONTRAST LOW-DOSE FOR LUNG CANCER SCREENING
TECHNIQUE: Multidetector CT imaging of the chest was performed following the
standard protocol without IV contrast.

[Series 3: lung 1.00 · axial · 0.71mm/px · z∈[-1234,-898]mm · 12 of 370 slices shown, 15 images]
[im 17/370  mediastinal]
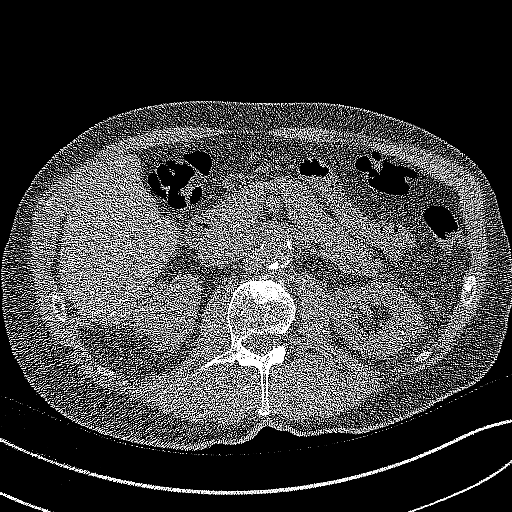
[im 17/370  lung]
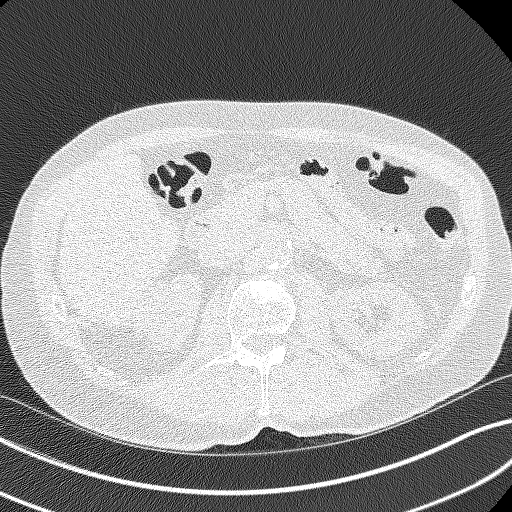
[im 51/370  lung]
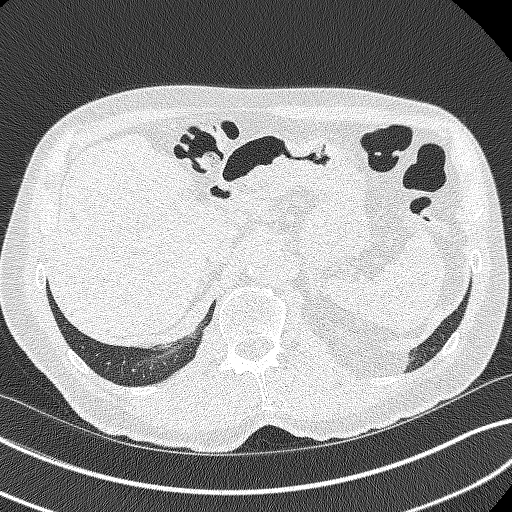
[im 84/370  lung]
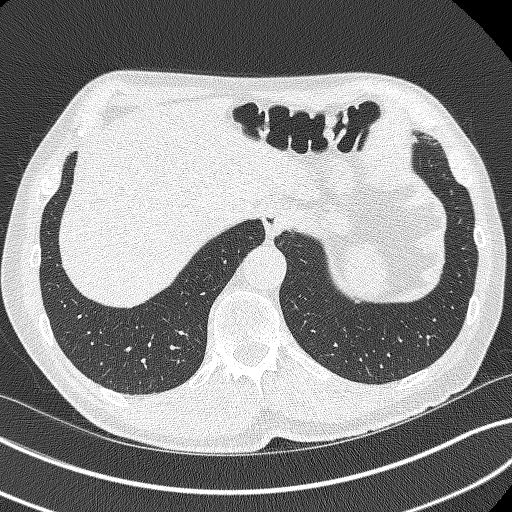
[im 118/370  lung]
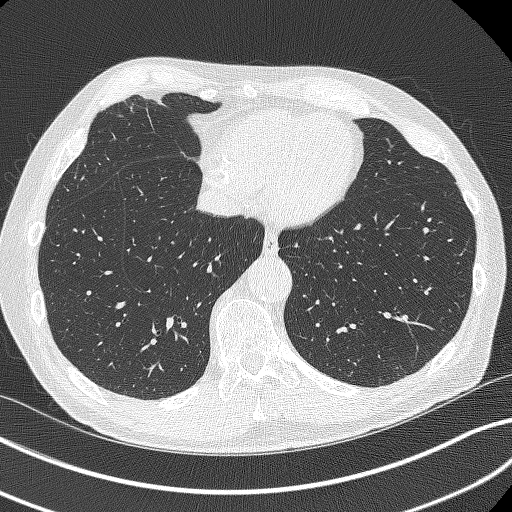
[im 135/370  mediastinal]
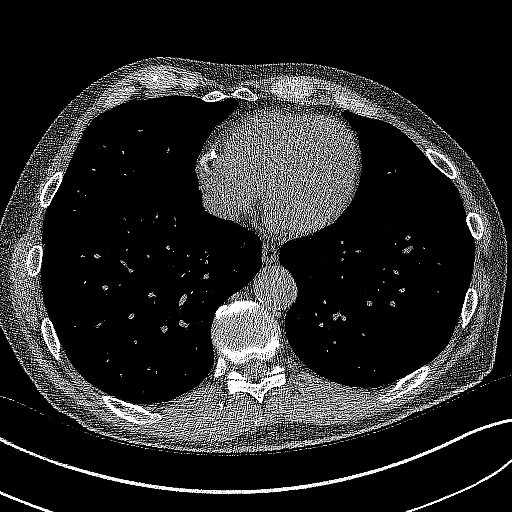
[im 135/370  lung]
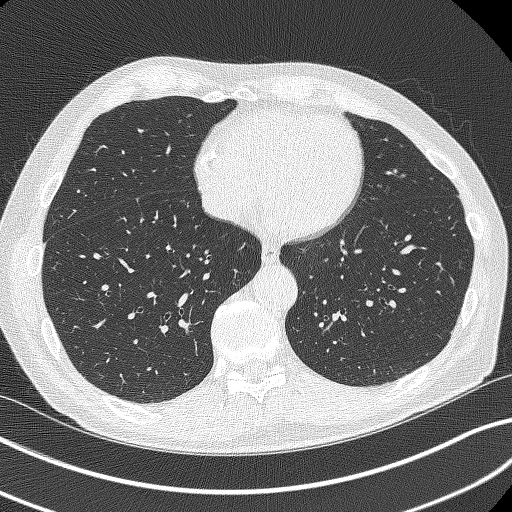
[im 168/370  lung]
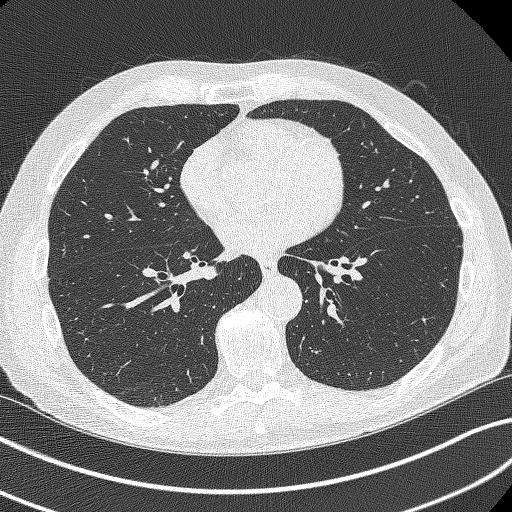
[im 202/370  lung]
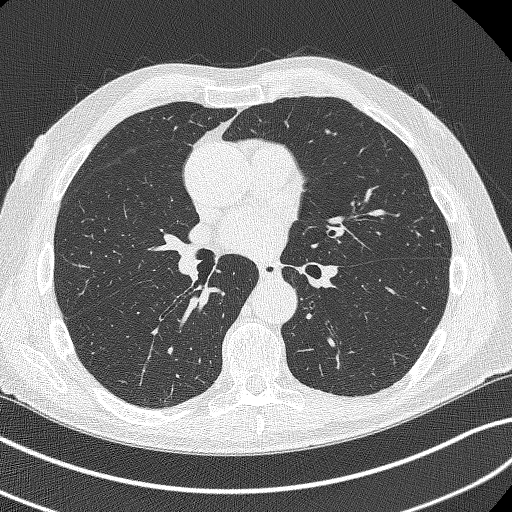
[im 235/370  lung]
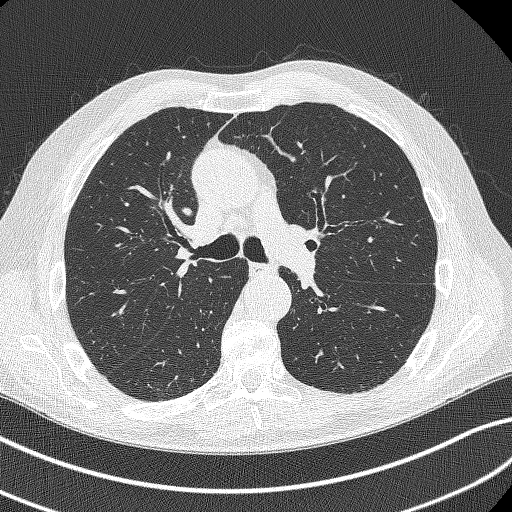
[im 252/370  mediastinal]
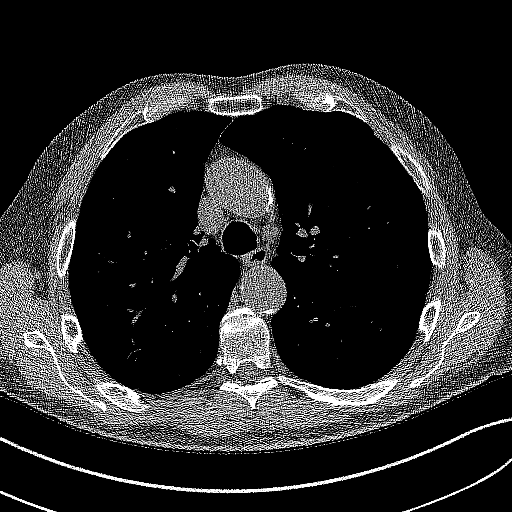
[im 252/370  lung]
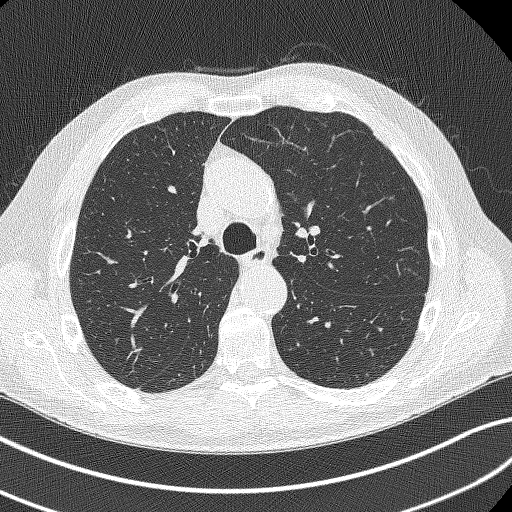
[im 286/370  lung]
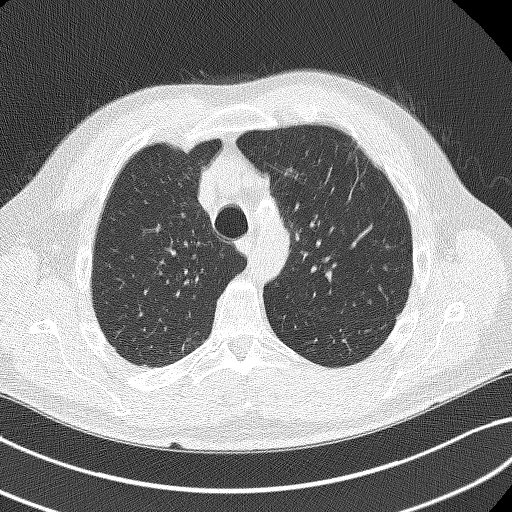
[im 319/370  lung]
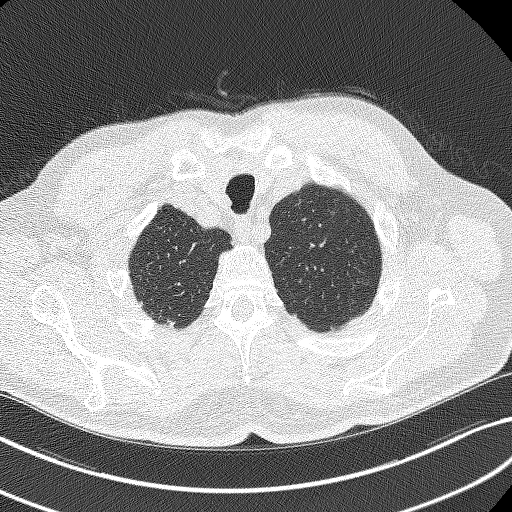
[im 353/370  lung]
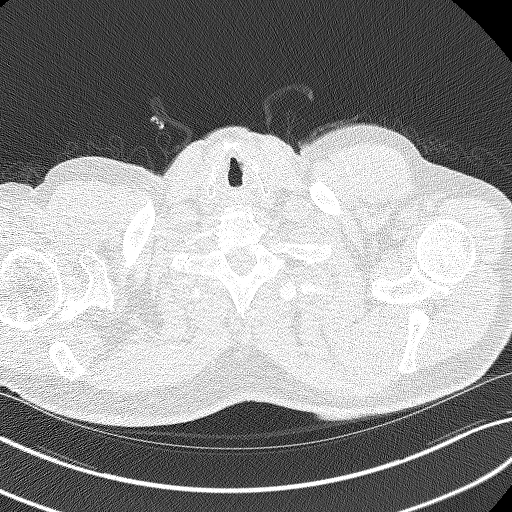

[Series 5: coronals lung 1.00 cor · coronal · 0.71mm/px · 3 of 283 slices shown]
[im 57/283  lung]
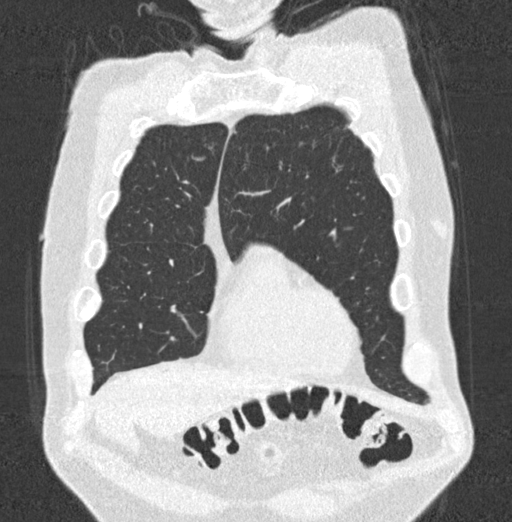
[im 113/283  lung]
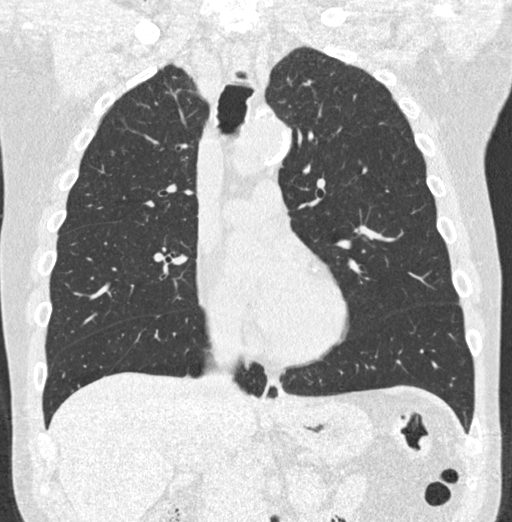
[im 170/283  lung]
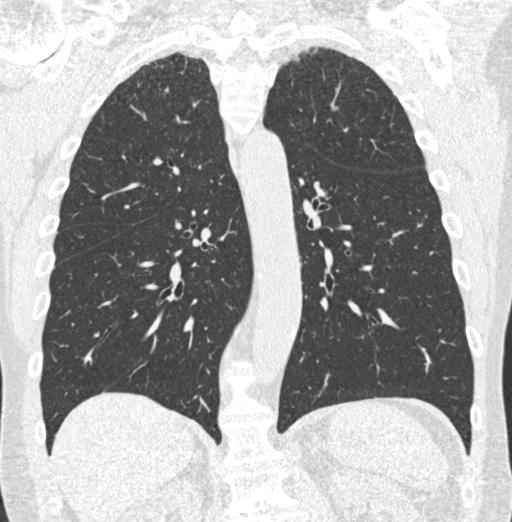

[15 of 40 positions shown; findings below may reference images not displayed]

FINDINGS: Cardiovascular: Normal heart size. No significant pericardial
effusion/thickening. Three-vessel coronary atherosclerosis.
Atherosclerotic thoracic aorta with 4.5 cm ascending thoracic aortic
aneurysm. Normal caliber pulmonary arteries.

Mediastinum/Nodes: No discrete thyroid nodules. Unremarkable
esophagus. No pathologically enlarged axillary, mediastinal or hilar
lymph nodes, noting limited sensitivity for the detection of hilar
adenopathy on this noncontrast study.

Lungs/Pleura: No pneumothorax. No pleural effusion. Mild
centrilobular emphysema with mild diffuse bronchial wall thickening.
No acute consolidative airspace disease or lung masses. A few
scattered small solid pulmonary nodules, largest 4.3 mm in volume
derived mean diameter in the posterior right upper lobe (series
3/image 83).

Upper abdomen: No acute abnormality.

Musculoskeletal: No aggressive appearing focal osseous lesions. Mild
thoracic spondylosis.
IMPRESSION: 1. Lung-RADS 2, benign appearance or behavior. Continue annual
screening with low-dose chest CT without contrast in 12 months.
2. Three-vessel coronary atherosclerosis.
3. Ascending thoracic aortic 4.5 cm aneurysm. Recommend semi-annual
imaging followup by CTA or MRA and referral to cardiothoracic
surgery if not already obtained. This recommendation follows 7888
ACCF/AHA/AATS/ACR/ASA/SCA/RIVARD/ROMELLO/TIGER/SCHREINER Guidelines for the
Diagnosis and Management of Patients With Thoracic Aortic Disease.
Circulation. 7888; 121: E266-e369. Aortic aneurysm NOS (HLOAI-N5P.I)
4. Aortic Atherosclerosis (HLOAI-E1R.R) and Emphysema (HLOAI-Q0B.2).

## 2022-05-30 ENCOUNTER — Emergency Department: Payer: No Typology Code available for payment source

## 2022-05-30 ENCOUNTER — Other Ambulatory Visit: Payer: Self-pay

## 2022-05-30 ENCOUNTER — Inpatient Hospital Stay: Payer: No Typology Code available for payment source

## 2022-05-30 ENCOUNTER — Inpatient Hospital Stay
Admission: EM | Admit: 2022-05-30 | Discharge: 2022-06-01 | DRG: 179 | Disposition: A | Discharge status: Home / Self Care | Payer: No Typology Code available for payment source | Attending: Internal Medicine | Admitting: Internal Medicine

## 2022-05-30 DIAGNOSIS — I10 Essential (primary) hypertension: Secondary | ICD-10-CM | POA: Diagnosis present

## 2022-05-30 DIAGNOSIS — Z801 Family history of malignant neoplasm of trachea, bronchus and lung: Secondary | ICD-10-CM | POA: Diagnosis not present

## 2022-05-30 DIAGNOSIS — I712 Thoracic aortic aneurysm, without rupture, unspecified: Secondary | ICD-10-CM | POA: Diagnosis present

## 2022-05-30 DIAGNOSIS — E43 Unspecified severe protein-calorie malnutrition: Secondary | ICD-10-CM | POA: Insufficient documentation

## 2022-05-30 DIAGNOSIS — N400 Enlarged prostate without lower urinary tract symptoms: Secondary | ICD-10-CM | POA: Diagnosis present

## 2022-05-30 DIAGNOSIS — Z87891 Personal history of nicotine dependence: Secondary | ICD-10-CM | POA: Diagnosis not present

## 2022-05-30 DIAGNOSIS — Z79899 Other long term (current) drug therapy: Secondary | ICD-10-CM | POA: Diagnosis not present

## 2022-05-30 DIAGNOSIS — Z720 Tobacco use: Secondary | ICD-10-CM | POA: Diagnosis present

## 2022-05-30 DIAGNOSIS — R32 Unspecified urinary incontinence: Secondary | ICD-10-CM | POA: Diagnosis present

## 2022-05-30 DIAGNOSIS — J850 Gangrene and necrosis of lung: Principal | ICD-10-CM

## 2022-05-30 DIAGNOSIS — Z831 Family history of other infectious and parasitic diseases: Secondary | ICD-10-CM | POA: Diagnosis not present

## 2022-05-30 DIAGNOSIS — J851 Abscess of lung with pneumonia: Principal | ICD-10-CM | POA: Diagnosis present

## 2022-05-30 DIAGNOSIS — I2510 Atherosclerotic heart disease of native coronary artery without angina pectoris: Secondary | ICD-10-CM | POA: Diagnosis present

## 2022-05-30 DIAGNOSIS — I7121 Aneurysm of the ascending aorta, without rupture: Secondary | ICD-10-CM | POA: Diagnosis present

## 2022-05-30 DIAGNOSIS — Z888 Allergy status to other drugs, medicaments and biological substances status: Secondary | ICD-10-CM | POA: Diagnosis not present

## 2022-05-30 DIAGNOSIS — I700 Atherosclerosis of aorta: Secondary | ICD-10-CM | POA: Diagnosis present

## 2022-05-30 DIAGNOSIS — E7800 Pure hypercholesterolemia, unspecified: Secondary | ICD-10-CM | POA: Diagnosis present

## 2022-05-30 DIAGNOSIS — J869 Pyothorax without fistula: Secondary | ICD-10-CM | POA: Diagnosis not present

## 2022-05-30 DIAGNOSIS — R413 Other amnesia: Secondary | ICD-10-CM | POA: Diagnosis present

## 2022-05-30 DIAGNOSIS — R4182 Altered mental status, unspecified: Secondary | ICD-10-CM | POA: Diagnosis present

## 2022-05-30 LAB — URINALYSIS, ROUTINE W REFLEX MICROSCOPIC
Bacteria, UA: NONE SEEN
Bilirubin Urine: NEGATIVE
Glucose, UA: NEGATIVE mg/dL
Hgb urine dipstick: NEGATIVE
Ketones, ur: 5 mg/dL — AB
Leukocytes,Ua: NEGATIVE
Nitrite: NEGATIVE
Protein, ur: 30 mg/dL — AB
Specific Gravity, Urine: 1.021 (ref 1.005–1.030)
Squamous Epithelial / HPF: NONE SEEN /HPF (ref 0–5)
pH: 6 (ref 5.0–8.0)

## 2022-05-30 LAB — CBC WITH DIFFERENTIAL/PLATELET
Abs Immature Granulocytes: 0.06 10*3/uL (ref 0.00–0.07)
Basophils Absolute: 0.1 10*3/uL (ref 0.0–0.1)
Basophils Relative: 0 %
Eosinophils Absolute: 0 10*3/uL (ref 0.0–0.5)
Eosinophils Relative: 0 %
HCT: 37.4 % — ABNORMAL LOW (ref 39.0–52.0)
Hemoglobin: 11.6 g/dL — ABNORMAL LOW (ref 13.0–17.0)
Immature Granulocytes: 1 %
Lymphocytes Relative: 10 %
Lymphs Abs: 1.2 10*3/uL (ref 0.7–4.0)
MCH: 27.1 pg (ref 26.0–34.0)
MCHC: 31 g/dL (ref 30.0–36.0)
MCV: 87.4 fL (ref 80.0–100.0)
Monocytes Absolute: 0.9 10*3/uL (ref 0.1–1.0)
Monocytes Relative: 7 %
Neutro Abs: 9.8 10*3/uL — ABNORMAL HIGH (ref 1.7–7.7)
Neutrophils Relative %: 82 %
Platelets: 361 10*3/uL (ref 150–400)
RBC: 4.28 MIL/uL (ref 4.22–5.81)
RDW: 15.7 % — ABNORMAL HIGH (ref 11.5–15.5)
WBC: 12 10*3/uL — ABNORMAL HIGH (ref 4.0–10.5)
nRBC: 0 % (ref 0.0–0.2)

## 2022-05-30 LAB — LACTIC ACID, PLASMA
Lactic Acid, Venous: 1.1 mmol/L (ref 0.5–1.9)
Lactic Acid, Venous: 1 mmol/L (ref 0.5–1.9)

## 2022-05-30 LAB — MRSA NEXT GEN BY PCR, NASAL: MRSA by PCR Next Gen: NOT DETECTED

## 2022-05-30 LAB — COMPREHENSIVE METABOLIC PANEL
ALT: 15 U/L (ref 0–44)
AST: 23 U/L (ref 15–41)
Albumin: 3 g/dL — ABNORMAL LOW (ref 3.5–5.0)
Alkaline Phosphatase: 76 U/L (ref 38–126)
Anion gap: 10 (ref 5–15)
BUN: 13 mg/dL (ref 8–23)
CO2: 25 mmol/L (ref 22–32)
Calcium: 8.4 mg/dL — ABNORMAL LOW (ref 8.9–10.3)
Chloride: 98 mmol/L (ref 98–111)
Creatinine, Ser: 0.7 mg/dL (ref 0.61–1.24)
GFR, Estimated: >60 mL/min (ref >60)
Glucose, Bld: 93 mg/dL (ref 70–99)
Potassium: 3.9 mmol/L (ref 3.5–5.1)
Sodium: 133 mmol/L — ABNORMAL LOW (ref 135–145)
Total Bilirubin: 0.5 mg/dL (ref 0.3–1.2)
Total Protein: 7.8 g/dL (ref 6.5–8.1)

## 2022-05-30 LAB — PROTIME-INR
INR: 1.1 (ref 0.8–1.2)
Prothrombin Time: 14.5 seconds (ref 11.4–15.2)

## 2022-05-30 LAB — PHOSPHORUS: Phosphorus: 3.3 mg/dL (ref 2.5–4.6)

## 2022-05-30 LAB — PROCALCITONIN: Procalcitonin: <0.1 ng/mL

## 2022-05-30 LAB — MAGNESIUM: Magnesium: 2 mg/dL (ref 1.7–2.4)

## 2022-05-30 MED ORDER — SODIUM CHLORIDE 0.9 % IV SOLN
2.0000 g | Freq: Three times a day (TID) | INTRAVENOUS | Status: DC
  Administered 2022-05-30 – 2022-06-01 (×5): 2 g via INTRAVENOUS
  Filled 2022-05-30 (×8): qty 12.5

## 2022-05-30 MED ORDER — CLINDAMYCIN PHOSPHATE 900 MG/50ML IV SOLN
900.0000 mg | Freq: Once | INTRAVENOUS | Status: DC
  Filled 2022-05-30: qty 50

## 2022-05-30 MED ORDER — VANCOMYCIN HCL IN DEXTROSE 1-5 GM/200ML-% IV SOLN
1000.0000 mg | Freq: Two times a day (BID) | INTRAVENOUS | Status: DC
  Administered 2022-05-31: 1000 mg via INTRAVENOUS
  Filled 2022-05-30 (×2): qty 200

## 2022-05-30 MED ORDER — ONDANSETRON HCL 4 MG/2ML IJ SOLN: 4.0000 mg | Freq: Four times a day (QID) | INTRAMUSCULAR | Status: DC | PRN

## 2022-05-30 MED ORDER — METRONIDAZOLE 500 MG/100ML IV SOLN
500.0000 mg | Freq: Two times a day (BID) | INTRAVENOUS | Status: DC
  Administered 2022-05-30 – 2022-06-01 (×4): 500 mg via INTRAVENOUS
  Filled 2022-05-30 (×5): qty 100

## 2022-05-30 MED ORDER — ONDANSETRON HCL 4 MG PO TABS: 4.0000 mg | ORAL_TABLET | Freq: Four times a day (QID) | ORAL | Status: DC | PRN

## 2022-05-30 MED ORDER — SENNOSIDES-DOCUSATE SODIUM 8.6-50 MG PO TABS: 1.0000 | ORAL_TABLET | Freq: Every evening | ORAL | Status: DC | PRN

## 2022-05-30 MED ORDER — MELATONIN 5 MG PO TABS
5.0000 mg | ORAL_TABLET | Freq: Every evening | ORAL | Status: DC | PRN
  Administered 2022-05-30: 5 mg via ORAL
  Filled 2022-05-30: qty 1

## 2022-05-30 MED ORDER — SODIUM CHLORIDE 0.9 % IV SOLN
2.0000 g | Freq: Once | INTRAVENOUS | Status: AC
  Administered 2022-05-30: 2 g via INTRAVENOUS
  Filled 2022-05-30: qty 20

## 2022-05-30 MED ORDER — HEPARIN SODIUM (PORCINE) 5000 UNIT/ML IJ SOLN
5000.0000 [IU] | Freq: Three times a day (TID) | INTRAMUSCULAR | Status: DC
  Administered 2022-05-30 – 2022-06-01 (×6): 5000 [IU] via SUBCUTANEOUS
  Filled 2022-05-30 (×6): qty 1

## 2022-05-30 MED ORDER — SODIUM CHLORIDE 0.9 % IV SOLN: INTRAVENOUS | Status: AC

## 2022-05-30 MED ORDER — ACETAMINOPHEN 650 MG RE SUPP: 650.0000 mg | Freq: Four times a day (QID) | RECTAL | Status: DC | PRN

## 2022-05-30 MED ORDER — ACETAMINOPHEN 325 MG PO TABS
650.0000 mg | ORAL_TABLET | Freq: Four times a day (QID) | ORAL | Status: DC | PRN
  Administered 2022-05-30: 650 mg via ORAL
  Filled 2022-05-30: qty 2

## 2022-05-30 MED ORDER — VANCOMYCIN HCL 2000 MG/400ML IV SOLN
2000.0000 mg | Freq: Once | INTRAVENOUS | Status: AC
  Administered 2022-05-30: 2000 mg via INTRAVENOUS
  Filled 2022-05-30: qty 400

## 2022-05-30 MED ORDER — HYDRALAZINE HCL 20 MG/ML IJ SOLN: 5.0000 mg | Freq: Three times a day (TID) | INTRAMUSCULAR | Status: DC | PRN

## 2022-05-30 NOTE — ED Notes (Signed)
Pt to Xray at this time

## 2022-05-30 NOTE — ED Notes (Signed)
Pt ambulatory independently to bathroom 

## 2022-05-30 NOTE — ED Triage Notes (Signed)
Pt states he has infection in his left lung, pt was told he needs surgery to remove it. Pt was seen at [facility] in [facility] and told the pt it was urgent and to go the ED. Per daughter pt seemed a bit disoriented but pt alert and oriented x 4 for this RN. Pt states he did not want to come but his daughter wanted him to come.

## 2022-05-30 NOTE — ED Provider Notes (Signed)
[facility] Provider Note    Event Date/Time   First MD Initiated Contact with Patient 05/30/22 1504     (approximate)   History   Chief Complaint infection   HPI  Jaime Blackburn is a 77 y.o. male with past medical history of hyperlipidemia, BPH, and aortic aneurysm who presents to the ED complaining of infection.  Patient reports that he has been dealing with significant sweating at night as well as a dry cough for multiple weeks.  He has been seeing his PCP for this problem, noted to have abnormal CT imaging 1 week ago showing necrotizing pneumonia and possible abscess.  He was started on moxifloxacin, but continues to have significant sweats and cough.  He denies any fevers, difficulty breathing, or pain in his chest.  He has not had any nausea, vomiting, abdominal pain, or diarrhea.  He spoke with his PCPs office earlier today, who recommended he come to the ED for further evaluation.     Physical Exam   Triage Vital Signs: ED Triage Vitals  Enc Vitals Group     BP 05/30/22 1443 (!) 155/84     Pulse Rate 05/30/22 1443 (!) 101     Resp 05/30/22 1443 16     Temp 05/30/22 1443 97.7 F (36.5 C)     Temp Source 05/30/22 1443 Oral     SpO2 05/30/22 1443 97 %     Weight 05/30/22 1444 171 lb (77.6 kg)     Height 05/30/22 1444 5\' 11"  (1.803 m)     Head Circumference --      Peak Flow --      Pain Score 05/30/22 1444 0     Pain Loc --      Pain Edu? --      Excl. in [place]? --     Most recent vital signs: Vitals:   05/30/22 1443 05/30/22 1511  BP: (!) 155/84 139/82  Pulse: (!) 101 94  Resp: 16 20  Temp: 97.7 F (36.5 C)   SpO2: 97% 97%    Constitutional: Alert and oriented. Eyes: Conjunctivae are normal. Head: Atraumatic. Nose: No congestion/rhinnorhea. Mouth/Throat: Mucous membranes are moist.  Cardiovascular: Normal rate, regular rhythm. Grossly normal heart sounds.  2+ radial pulses bilaterally. Respiratory: Normal respiratory effort.  No  retractions. Lungs CTAB. Gastrointestinal: Soft and nontender. No distention. Musculoskeletal: No lower extremity tenderness nor edema.  Neurologic:  Normal speech and language. No gross focal neurologic deficits are appreciated.    ED Results / Procedures / Treatments   Labs (all labs ordered are listed, but only abnormal results are displayed) Labs Reviewed  COMPREHENSIVE METABOLIC PANEL - Abnormal; Notable for the following components:      Result Value   Sodium 133 (*)    Calcium 8.4 (*)    Albumin 3.0 (*)    All other components within normal limits  CBC WITH DIFFERENTIAL/PLATELET - Abnormal; Notable for the following components:   WBC 12.0 (*)    Hemoglobin 11.6 (*)    HCT 37.4 (*)    RDW 15.7 (*)    Neutro Abs 9.8 (*)    All other components within normal limits  URINALYSIS, ROUTINE W REFLEX MICROSCOPIC - Abnormal; Notable for the following components:   Color, Urine YELLOW (*)    APPearance HAZY (*)    Ketones, ur 5 (*)    Protein, ur 30 (*)    All other components within normal limits  CULTURE, BLOOD (ROUTINE X 2)  CULTURE, BLOOD (ROUTINE X 2)  LACTIC ACID, PLASMA  PROTIME-INR  LACTIC ACID, PLASMA     EKG  ED ECG REPORT I, Blake Divine, the attending physician, personally viewed and interpreted this ECG.   Date: 05/30/2022  EKG Time: 16:19  Rate: 91  Rhythm: normal sinus rhythm  Axis: Normal  Intervals: Prolonged QT  ST&T Change: None  RADIOLOGY Chest x-ray reviewed and interpreted by me with right lower lobe infiltrate.  PROCEDURES:  Critical Care performed: No  Procedures   MEDICATIONS ORDERED IN ED: Medications  clindamycin (CLEOCIN) IVPB 900 mg (has no administration in time range)  cefTRIAXone (ROCEPHIN) 2 g in sodium chloride 0.9 % 100 mL IVPB (2 g Intravenous New Bag/Given 05/30/22 1641)     IMPRESSION / MDM / ASSESSMENT AND PLAN / ED COURSE  I reviewed the triage vital signs and the nursing notes.                               77 y.o. male with past medical history of aortic aneurysm, hyperlipidemia, and BPH who presents to the ED complaining of multiple weeks of night sweats and dry cough, found to have abnormal imaging as an outpatient.  Patient's presentation is most consistent with acute presentation with potential threat to life or bodily function.  Differential diagnosis includes, but is not limited to, pneumonia, lung abscess, aspiration, electrolyte abnormality, AKI, sepsis.  Patient nontoxic-appearing and in no acute distress, vital signs are unremarkable and do not appear concerning for sepsis.  I did review his outpatient CT imaging that shows evidence of necrotizing pneumonia in the right lower lobe with developing abscess.  Patient is not in any respiratory distress and maintaining oxygen saturations at 97% on room air.  We will further assess with blood cultures and lactic acid in addition to basic labs.  Patient would benefit from admission for IV antibiotics given complicated pneumonia.  We will start him on IV Rocephin and clindamycin for now.  Labs are reassuring with mild leukocytosis but no significant anemia noted, no significant electrolyte abnormality or AKI noted and lactic acid within normal limits.  Patient receiving IV antibiotics and case discussed with hospitalist for admission.      FINAL CLINICAL IMPRESSION(S) / ED DIAGNOSES   Final diagnoses:  Necrotizing pneumonia ([place])     Rx / DC Orders   ED Discharge Orders     None        Note:  This document was prepared using Dragon voice recognition software and may include unintentional dictation errors.   Blake Divine, MD 05/30/22 [phone]

## 2022-05-30 NOTE — Assessment & Plan Note (Signed)
-   Patient takes tamsulosin 0.4 mg daily as needed

## 2022-05-30 NOTE — Progress Notes (Signed)
Pt states takes melatonin at home and is requesting for it. NP Randol Kern made aware. Will continue to monitor.

## 2022-05-30 NOTE — H&P (Signed)
History and Physical   [name] WOE:[id number] DOB: Sep 20, 1945 DOA: 05/30/2022  PCP: [facility]  Outpatient Specialists: Dr. Haig Prophet, gastroenterology service Patient coming from: home  I have personally briefly reviewed patient's old medical records in [place].  Chief Concern: Altered mental status, night sweats and cough  HPI: Jaime Blackburn is a 77 year old male with history of BPH, hyperlipidemia, aortic aneurysm, who presents emergency department for chief concerns of altered mental status in setting of recent diagnosis of left lower lung infection/abscess.  Initial vitals in the ED showed temperature of 97.7, respiration rate of 16, heart rate of 101, blood pressure 155/84, SpO2 of 97% on room air.  Serum sodium is 133, potassium 3.9, chloride 98, bicarb 25, BUN of 13, serum creatinine 0.70, EGFR greater than 60, nonfasting blood glucose 93, WBC 12, hemoglobin 11.6, platelets of 361.  UA was negative for leukocytes and nitrites.  ED treatment: Ceftriaxone 2 g IV, clindamycin one-time dose were ordered. ----------------------- At bedside, patient is able to tell me his name, age, current calendar year, current location.  He reports about 4 to 5 weeks ago he developed congestion, cough.  He denies fever at the time.  He reports that the symptoms resolved after about 3 to 4 days.  However the cough remained.  He reports the cough is nonproductive.  He denies chest pain, shortness of breath, trauma to his person, known sick contacts. He denies nausea, vomiting, swelling of his lower extremity, dysuria, hematuria, diarrhea.  He presents to the ED today because he feels like his head is heavy.  Social history: Patient denies tobacco, EtOH, recreational drug use.  He is retired and formally was in [employer] and has traveled to multiple countries incontinence around the world including [place] specifically [place], [place],  [place].  ROS: Constitutional: no weight change, no fever ENT/Mouth: no sore throat, no rhinorrhea Eyes: no eye pain, no vision changes Cardiovascular: no chest pain, no dyspnea,  no edema, no palpitations Respiratory: + cough, no sputum, no wheezing Gastrointestinal: no nausea, no vomiting, no diarrhea, no constipation Genitourinary: no urinary incontinence, no dysuria, no hematuria Musculoskeletal: no arthralgias, no myalgias Skin: no skin lesions, no pruritus, Neuro: + weakness, no loss of consciousness, no syncope Psych: no anxiety, no depression, no decrease appetite Heme/Lymph: no bruising, no bleeding  ED Course: Discussed with emergency medicine provider, patient requiring hospitalization for chief concerns of empyema/necrotic pneumonia, failed outpatient therapy.  Assessment/Plan  Principal Problem:   Empyema (HCC) Active Problems:   Ascending aortic aneurysm (HCC)   Benign prostatic hyperplasia without urinary obstruction   Pure hypercholesterolemia   Current tobacco use   Essential hypertension   Altered mental status   Assessment and Plan:  * Empyema (HCC) - Discontinue clindamycin - Ordered metronidazole 500 mg IV twice daily - Patient is status post ceftriaxone 2 g IV one-time dose - I have ordered cefepime per pharmacy - Ordered MRSA PCR, if positive will add vancomycin - Lactic acid was negative x 2, this is reassuring - Agree with blood cultures x 2 - Check procalcitonin - Pulmonary consultation has been placed to Dr. Lanney Gins - Admit to telemetry cardiac, inpatient  Altered mental status - Per daughter, patient has had short-term memory loss over the last few days - Patient endorses feeling a heaviness in his head, not quite headache - CT head without contrast ordered  Essential hypertension - Lisinopril 20 mg daily  Pure hypercholesterolemia - Patient takes atorvastatin 40 mg  daily  Benign prostatic hyperplasia without urinary obstruction -  Patient takes tamsulosin 0.4 mg daily as needed  Elevated QTc, no prior EKG for comparison - Will check phosphorus and magnesium level - Avoid scheduled QTc prolonging agents  Chart reviewed.   05/23/2022: Patient was diagnosed with right lower lobe and adjacent right middle lobe consolidation with large central area of fluid attenuation consistent with necrotic pneumonia/lung abscess  05/28/2022: Patient was prescribed moxifloxacin 400 mg daily for 10 days.  Referral to outpatient pulmonology specialist was placed.  DVT prophylaxis: heparin 5000 units q8h Code Status: Full code Diet: Heart healthy Family Communication: Daughter, Olivia Mackie at bedside with patient's permission Disposition Plan: Pending clinical course Consults called:  Admission status: Telemetry cardiac, inpatient  Past Medical History:  Diagnosis Date   Arthritis    Essential hypertension 05/30/2022   Past Surgical History:  Procedure Laterality Date   FINGER AMPUTATION     FOOT SURGERY     Social History:  reports that he quit smoking about 10 years ago. His smoking use included cigarettes. He has a 30.00 pack-year smoking history. He has never used smokeless tobacco. He reports current alcohol use of about 14.0 standard drinks of alcohol per week. He reports that he does not use drugs.  Allergies  Allergen Reactions   Propoxyphene     Other reaction(s): Other (See Comments)   Family History  Problem Relation Age of Onset   Colon cancer Mother    Stroke Mother    Tuberculosis Father    Stroke Maternal Grandmother    Lung cancer Paternal Grandfather    Family history: Family history reviewed and not pertinent.  Prior to Admission medications   Medication Sig Start Date End Date Taking? Authorizing Provider  atorvastatin (LIPITOR) 40 MG tablet Take 40 mg by mouth daily at 6 PM.  07/27/15   [provider]  bimatoprost (LUMIGAN) 0.01 % SOLN Place 1 drop into both eyes at bedtime.     [provider]  diclofenac sodium (VOLTAREN) 1 % GEL Apply 4 g topically 4 (four) times daily. 10/25/15   Hyatt, Max T, DPM  fluticasone (FLONASE) 50 MCG/ACT nasal spray Place 1 spray into both nostrils as needed.  10/07/15   [provider]  ibuprofen (ADVIL,MOTRIN) 200 MG tablet Take 200 mg by mouth at bedtime.     [provider]  ivabradine (CORLANOR) 7.5 MG TABS tablet Take tablets (15mg ) TWO hours prior to your cardiac CT scan. 05/21/22   Kate Sable, MD  lisinopril (ZESTRIL) 20 MG tablet Take by mouth. 05/04/21   [provider]  meclizine (ANTIVERT) 25 MG tablet Take 1 tablet (25 mg total) by mouth 3 (three) times daily as needed for dizziness. 11/09/21   Laurene Footman B, PA-C  metoprolol tartrate (LOPRESSOR) 100 MG tablet Take tablet (100mg ) TWO hours prior to your cardiac. 05/21/22   Kate Sable, MD  Multiple Vitamins-Minerals (MULTIVITAMIN ADULT PO) Take 1 tablet by mouth daily.    [provider]  nystatin ointment (MYCOSTATIN) Apply 1 application topically as needed.  11/03/14   [provider]  Omega 3 1200 MG CAPS Take 1 capsule by mouth daily.  07/17/10   [provider]  ondansetron (ZOFRAN-ODT) 4 MG disintegrating tablet Take 1 tablet (4 mg total) by mouth every 8 (eight) hours as needed for nausea or vomiting. 11/09/21   Laurene Footman B, PA-C  tamsulosin (FLOMAX) 0.4 MG CAPS capsule Take 0.4 mg by mouth as needed.  09/28/15  [provider]  TRAMADOL HCL PO Take by mouth.    [provider]   Physical Exam: Vitals:   05/30/22 1444 05/30/22 1511 05/30/22 1830 05/30/22 1852  BP:  139/82 (!) 148/98   Pulse:  94 93   Resp:  20 19   Temp:    97.8 F (36.6 C)  TempSrc:    Oral  SpO2:  97% 96%   Weight: 77.6 kg     Height: 5\' 11"  (1.803 m)      Constitutional: appears age-appropriate, NAD, calm, comfortable Eyes: PERRL, lids and conjunctivae normal ENMT: Mucous membranes are moist. Posterior pharynx  clear of any exudate or lesions. Age-appropriate dentition. Hearing appropriate Neck: normal, supple, no masses, no thyromegaly Respiratory: Right lower lobe decreased lung sounds, no wheezing, no crackles. Normal respiratory effort. No accessory muscle use.  Cardiovascular: Regular rate and rhythm, no murmurs / rubs / gallops. No extremity edema. 2+ pedal pulses. No carotid bruits.  Abdomen: no tenderness, no masses palpated, no hepatosplenomegaly. Bowel sounds positive.  Musculoskeletal: no clubbing / cyanosis. No joint deformity upper and lower extremities. Good ROM, no contractures, no atrophy. Normal muscle tone.  Skin: no rashes, lesions, ulcers. No induration Neurologic: Sensation intact. Strength 5/5 in all 4.  Psychiatric: Normal judgment and insight. Alert and oriented x 3. Normal mood.   EKG: independently reviewed, showing sinus rhythm with rate of 91, QTc 506  Chest x-ray on Admission: I personally reviewed and I agree with radiologist reading as below.  DG Chest 2 View  Result Date: 05/30/2022 CLINICAL DATA:  Possible sepsis.  Recent lung infection. EXAM: CHEST - 2 VIEW COMPARISON:  05/21/2022 and chest CT 05/23/2022 FINDINGS: Lungs are adequately inflated demonstrate evidence of focal opacification of the posterior right lower lobe without significant change and known to represent a subpleural fluid collection likely patient's reported infection/abscess. Small amount right pleural fluid. Left lung is clear. Cardiomediastinal silhouette and remainder of the exam is unchanged. IMPRESSION: Stable focal opacification over the posterior right lower lobe likely patient's reported infection/abscess. Small amount right pleural fluid. Electronically Signed   By: Marin Olp M.D.   On: 05/30/2022 15:42    Labs on Admission: I have personally reviewed following labs  CBC: Recent Labs  Lab 05/30/22 1458  WBC 12.0*  NEUTROABS 9.8*  HGB 11.6*  HCT 37.4*  MCV 87.4  PLT 161   Basic  Metabolic Panel: Recent Labs  Lab 05/30/22 1458  NA 133*  K 3.9  CL 98  CO2 25  GLUCOSE 93  BUN 13  CREATININE 0.70  CALCIUM 8.4*  MG 2.0  PHOS 3.3   GFR: Estimated Creatinine Clearance: 85 mL/min (by C-G formula based on SCr of 0.7 mg/dL).  Liver Function Tests: Recent Labs  Lab 05/30/22 1458  AST 23  ALT 15  ALKPHOS 76  BILITOT 0.5  PROT 7.8  ALBUMIN 3.0*   Coagulation Profile: Recent Labs  Lab 05/30/22 1458  INR 1.1   Urine analysis:    Component Value Date/Time   COLORURINE YELLOW (A) 05/30/2022 1447   APPEARANCEUR HAZY (A) 05/30/2022 1447   LABSPEC 1.021 05/30/2022 1447   PHURINE 6.0 05/30/2022 1447   GLUCOSEU NEGATIVE 05/30/2022 1447   HGBUR NEGATIVE 05/30/2022 1447   BILIRUBINUR NEGATIVE 05/30/2022 1447   KETONESUR 5 (A) 05/30/2022 1447   PROTEINUR 30 (A) 05/30/2022 1447   NITRITE NEGATIVE 05/30/2022 1447   LEUKOCYTESUR NEGATIVE 05/30/2022 1447   This document was prepared using Dragon Voice Recognition software and  may include unintentional dictation errors.  Dr. Sedalia Muta [facility]  If 7PM-7AM, please contact overnight-coverage provider If 7AM-7PM, please contact day coverage provider [email or web address]  05/30/2022, 7:20 PM

## 2022-05-30 NOTE — Assessment & Plan Note (Signed)
-   Patient takes atorvastatin 40 mg daily

## 2022-05-30 NOTE — Assessment & Plan Note (Addendum)
-   Discontinue clindamycin - Ordered metronidazole 500 mg IV twice daily - Patient is status post ceftriaxone 2 g IV one-time dose - I have ordered cefepime per pharmacy - Ordered MRSA PCR, if positive will add vancomycin - Lactic acid was negative x 2, this is reassuring - Agree with blood cultures x 2 - Check procalcitonin - Pulmonary consultation has been placed to Dr. Lanney Gins - Admit to telemetry cardiac, inpatient

## 2022-05-30 NOTE — Hospital Course (Addendum)
Mr. Jaime Blackburn is a 77 year old male with history of BPH, hyperlipidemia, aortic aneurysm, who presents emergency department for chief concerns of altered mental status in setting of recent diagnosis of left lower lung infection/abscess.  Initial vitals in the ED showed temperature of 97.7, respiration rate of 16, heart rate of 101, blood pressure 155/84, SpO2 of 97% on room air.  Serum sodium is 133, potassium 3.9, chloride 98, bicarb 25, BUN of 13, serum creatinine 0.70, EGFR greater than 60, nonfasting blood glucose 93, WBC 12, hemoglobin 11.6, platelets of 361.  UA was negative for leukocytes and nitrites.  ED treatment: Ceftriaxone 2 g IV, clindamycin one-time dose were ordered.

## 2022-05-30 NOTE — Assessment & Plan Note (Signed)
-   Lisinopril 20 mg daily

## 2022-05-30 NOTE — ED Notes (Signed)
1 set of blood cultures, red top, blue top, light green top, grey top and lavender top obtained and sent to lab at this time.

## 2022-05-30 NOTE — Consult Note (Addendum)
Pharmacy Antibiotic Note  CHEROKEE CLOWERS is a 77 y.o. male admitted on 05/30/2022 with pneumonia.  Pharmacy has been consulted for vancomycin dosing. Concern for abscess.   Plan: Will give vancomycin 2000 mg x 1 followed by vancomycin 1000 mg q12H for a predicted AUC of 477. Goal AUC of 400-600. Scr 0.8, OBW, Vd 0.72. Plan to order vancomycin levels after 4th or 5th dose. Will order MRSA PCR.   Will order cefepime 2 g q8H per discussion with MD.   Continue flagyl 500 mg BID   Height: 5\' 11"  (180.3 cm) Weight: 77.6 kg (171 lb) IBW/kg (Calculated) : 75.3  Temp (24hrs), Avg:97.7 F (36.5 C), Min:97.7 F (36.5 C), Max:97.7 F (36.5 C)  Recent Labs  Lab 05/30/22 1458 05/30/22 1600  WBC 12.0*  --   CREATININE 0.70  --   LATICACIDVEN 1.1 1.0    Estimated Creatinine Clearance: 85 mL/min (by C-G formula based on SCr of 0.7 mg/dL).    Allergies  Allergen Reactions   Propoxyphene     Other reaction(s): Other (See Comments)    Antimicrobials this admission: 2/8 ceftriaxone x 1.  2/8 vancomycin  >>  2/8 cefepime >>   Dose adjustments this admission: None  Microbiology results: 2/8 BCx: pending 2/8 MRSA PCR: ordered.   Thank you for allowing pharmacy to be a part of this patient's care.  Oswald Hillock, PharmD, BCPS 05/30/2022 5:25 PM

## 2022-05-30 NOTE — Plan of Care (Signed)
  Problem: Clinical Measurements: Goal: Signs and symptoms of infection will decrease Outcome: Progressing   Problem: Respiratory: Goal: Ability to maintain adequate ventilation will improve Outcome: Progressing   Problem: Activity: Goal: Risk for activity intolerance will decrease Outcome: Progressing   Problem: Elimination: Goal: Will not experience complications related to urinary retention Outcome: Progressing   Problem: Pain Managment: Goal: General experience of comfort will improve Outcome: Progressing   Problem: Safety: Goal: Ability to remain free from injury will improve Outcome: Progressing

## 2022-05-30 NOTE — Assessment & Plan Note (Signed)
-   Per daughter, patient has had short-term memory loss over the last few days - Patient endorses feeling a heaviness in his head, not quite headache - CT head without contrast ordered

## 2022-05-31 ENCOUNTER — Inpatient Hospital Stay: Payer: No Typology Code available for payment source

## 2022-05-31 DIAGNOSIS — J869 Pyothorax without fistula: Secondary | ICD-10-CM | POA: Diagnosis not present

## 2022-05-31 LAB — CBC
HCT: 33 % — ABNORMAL LOW (ref 39.0–52.0)
Hemoglobin: 10.4 g/dL — ABNORMAL LOW (ref 13.0–17.0)
MCH: 27 pg (ref 26.0–34.0)
MCHC: 31.5 g/dL (ref 30.0–36.0)
MCV: 85.7 fL (ref 80.0–100.0)
Platelets: 275 10*3/uL (ref 150–400)
RBC: 3.85 MIL/uL — ABNORMAL LOW (ref 4.22–5.81)
RDW: 15.5 % (ref 11.5–15.5)
WBC: 5.9 10*3/uL (ref 4.0–10.5)
nRBC: 0 % (ref 0.0–0.2)

## 2022-05-31 LAB — BASIC METABOLIC PANEL
Anion gap: 6 (ref 5–15)
BUN: 11 mg/dL (ref 8–23)
CO2: 23 mmol/L (ref 22–32)
Calcium: 8 mg/dL — ABNORMAL LOW (ref 8.9–10.3)
Chloride: 105 mmol/L (ref 98–111)
Creatinine, Ser: 0.58 mg/dL — ABNORMAL LOW (ref 0.61–1.24)
GFR, Estimated: >60 mL/min (ref >60)
Glucose, Bld: 89 mg/dL (ref 70–99)
Potassium: 3.6 mmol/L (ref 3.5–5.1)
Sodium: 134 mmol/L — ABNORMAL LOW (ref 135–145)

## 2022-05-31 MED ORDER — LISINOPRIL 20 MG PO TABS
20.0000 mg | ORAL_TABLET | Freq: Every day | ORAL | Status: DC
  Administered 2022-05-31 – 2022-06-01 (×2): 20 mg via ORAL
  Filled 2022-05-31 (×2): qty 1

## 2022-05-31 MED ORDER — QUETIAPINE FUMARATE 25 MG PO TABS
25.0000 mg | ORAL_TABLET | Freq: Two times a day (BID) | ORAL | Status: DC
  Administered 2022-05-31 – 2022-06-01 (×2): 25 mg via ORAL
  Filled 2022-05-31 (×2): qty 1

## 2022-05-31 MED ORDER — ENSURE ENLIVE PO LIQD
237.0000 mL | Freq: Two times a day (BID) | ORAL | Status: DC
  Administered 2022-06-01: 237 mL via ORAL

## 2022-05-31 MED ORDER — ADULT MULTIVITAMIN W/MINERALS CH
1.0000 | ORAL_TABLET | Freq: Every day | ORAL | Status: DC
  Administered 2022-05-31 – 2022-06-01 (×2): 1 via ORAL
  Filled 2022-05-31 (×2): qty 1

## 2022-05-31 MED ORDER — TAMSULOSIN HCL 0.4 MG PO CAPS
0.4000 mg | ORAL_CAPSULE | Freq: Every day | ORAL | Status: DC
  Administered 2022-05-31 – 2022-06-01 (×2): 0.4 mg via ORAL
  Filled 2022-05-31 (×2): qty 1

## 2022-05-31 MED ORDER — MELATONIN 5 MG PO TABS
5.0000 mg | ORAL_TABLET | Freq: Every day | ORAL | Status: DC
  Administered 2022-05-31: 5 mg via ORAL
  Filled 2022-05-31: qty 1

## 2022-05-31 MED ORDER — ATORVASTATIN CALCIUM 20 MG PO TABS
40.0000 mg | ORAL_TABLET | Freq: Every day | ORAL | Status: DC
  Administered 2022-05-31: 40 mg via ORAL
  Filled 2022-05-31: qty 2

## 2022-05-31 NOTE — Progress Notes (Signed)
Initial Nutrition Assessment  DOCUMENTATION CODES:   Severe malnutrition in context of acute illness/injury  INTERVENTION:   -Liberalize diet to regular for wider variety of meal selections -MVI with minerals daily -Ensure Enlive po BID, each supplement provides 350 kcal and 20 grams of protein.   NUTRITION DIAGNOSIS:   Severe Malnutrition related to acute illness (lt lower lung abscess/ empyema) as evidenced by mild fat depletion, moderate fat depletion, mild muscle depletion, moderate muscle depletion.  GOAL:   Patient will meet greater than or equal to 90% of their needs  MONITOR:   PO intake, Supplement acceptance  REASON FOR ASSESSMENT:   Malnutrition Screening Tool    ASSESSMENT:   Pt with history of BPH, hyperlipidemia, aortic aneurysm, who presents for chief concerns of altered mental status in setting of recent diagnosis of left lower lung infection/abscess.  Pt admitted with lt empyema.   Reviewed I/O's: +342 ml x 24 hours  UOP: 695 ml x 24 hours   Pt awaiting pulmonology consult.   Spoke with pt at bedside, who is pleasant and in good spirits today. He reports feeling better since hospital admission. Pt endorses good appetite. Pt consumed 100% of breakfast along with a muffin brought in from home. Pt shares that PTA he consumes 3 meal per day (Breakfast: cereal or donut, Lunch: sandwich and chips, Dinner: meat, starch, and vegetable).   Pt shares that he was losing weight over the past year, however, weight loss has been more rapid in the past month secondary to lung abscess. Per pt, he estimates he has lost about 7-8# over the past month and "now I'm 7-8# less than I should be". Reviewed wt hx; pt has experienced a 4.9% wt loss over the past 7 months, which is not significant for time frame.   Discussed importance of good meal and supplement intake to promote healing. Pt amenable to Ensure.   Medications reviewed and include 0.9% sodium chloride infusion @  125 ml/hr.   Labs reviewed: Na: 134 (inpatient orders for glycemic control are none).    NUTRITION - FOCUSED PHYSICAL EXAM:  Flowsheet Row Most Recent Value  Orbital Region Moderate depletion  Upper Arm Region Moderate depletion  Thoracic and Lumbar Region Mild depletion  Buccal Region Mild depletion  Temple Region Moderate depletion  Clavicle Bone Region Moderate depletion  Clavicle and Acromion Bone Region Moderate depletion  Scapular Bone Region Moderate depletion  Dorsal Hand Moderate depletion  Patellar Region Moderate depletion  Anterior Thigh Region Moderate depletion  Posterior Calf Region Moderate depletion  Edema (RD Assessment) None  Hair Reviewed  Eyes Reviewed  Mouth Reviewed  Skin Reviewed  Nails Reviewed       Diet Order:   Diet Order             Diet regular Room service appropriate? Yes; Fluid consistency: Thin  Diet effective now                   EDUCATION NEEDS:   Education needs have been addressed  Skin:  Skin Assessment: Reviewed RN Assessment  Last BM:  05/30/22  Height:   Ht Readings from Last 1 Encounters:  05/30/22 5' 11"$  (1.803 m)    Weight:   Wt Readings from Last 1 Encounters:  05/30/22 74.6 kg    Ideal Body Weight:  78.2 kg  BMI:  Body mass index is 22.94 kg/m.  Estimated Nutritional Needs:   Kcal:  1950-2150  Protein:  105-120 grams  Fluid:  >  1.9 L    Loistine Chance, RD, LDN, [facility] [place] Registered Dietitian II Certified Diabetes Care and Education Specialist Please refer to [facility] for RD and/or RD on-call/weekend/after hours pager

## 2022-05-31 NOTE — TOC Initial Note (Addendum)
Transition of Care [facility]) - Initial/Assessment Note    Patient Details  Name: Jaime Blackburn MRN: [id number] Date of Birth: 14-Nov-1945  Transition of Care [facility]) CM/SW Contact:    Laurena Slimmer, RN Phone Number: 05/31/2022, 4:53 PM  Clinical Narrative:                 Spoke with patient daughter at bedside. She has concerns regarding patient's safety at discharge. She stated he is not safe to return home and she is unable to care for him at her home. She was advised patient would have to have a skilled need to have insurance reimburse. If supervision is the LOC needed that is provided by PCA agencies. Patient's daughter and family advised to search local PCA agencies. They were also informed PCA are private pay. They inquired about patient's having a diagnosis of dementia and ALF. They were provided with information for [facility] and [facility]. They are requesting to speak with the MD regarding imaging and discharge dates. MD notified. Patient's daughter would like to be called upon patient's discharge. MD notified.         Patient Goals and CMS Choice            Expected Discharge Plan and Services                                              Prior Living Arrangements/Services                       Activities of Daily Living Home Assistive Devices/Equipment: None ADL Screening (condition at time of admission) Patient's cognitive ability adequate to safely complete daily activities?: No Is the patient deaf or have difficulty hearing?: Yes Does the patient have difficulty seeing, even when wearing glasses/contacts?: No Does the patient have difficulty concentrating, remembering, or making decisions?: Yes Patient able to express need for assistance with ADLs?: Yes Does the patient have difficulty dressing or bathing?: No Independently performs ADLs?: No Communication: Independent Dressing (OT): Independent Grooming: Independent Feeding:  Independent Bathing: Needs assistance Is this a change from baseline?: Pre-admission baseline Toileting: Needs assistance Is this a change from baseline?: Pre-admission baseline In/Out Bed: Needs assistance Is this a change from baseline?: Pre-admission baseline Walks in Home: Independent (per pt report) Does the patient have difficulty walking or climbing stairs?: Yes Weakness of Legs: None Weakness of Arms/Hands: None  Permission Sought/Granted                  Emotional Assessment              Admission diagnosis:  Necrotizing pneumonia ([place]) [J85.0] Empyema ([place]) [J86.9] Patient Active Problem List   Diagnosis Date Noted   Empyema ([place]) 05/30/2022   Essential hypertension 05/30/2022   Altered mental status 05/30/2022   Leukopenia 06/04/2018   Thrombocytopenia ([place]) 06/04/2018   Arthritis, degenerative 10/25/2015   Ascending aortic aneurysm ([place]) 10/25/2014   Family history of glaucoma 07/04/2014   Benign prostatic hyperplasia without urinary obstruction 01/20/2014   Pure hypercholesterolemia 01/20/2014   Actinic keratoses 09/11/2010   H/O malignant neoplasm of skin 09/11/2010   Allergic rhinitis 06/26/2010   Hypercholesterolemia 06/26/2010   Current tobacco use 06/26/2010   PCP:  [facility]:   [place] [address] AT  [facility] [address] [place] [facility] [place] [address] Phone: [phone] Fax: [phone]  [place] [address] [place] [address] Phone: [phone] Fax: [phone]     Social Determinants of Health (SDOH) Social History: SDOH Screenings   Food Insecurity: No Food Insecurity (05/31/2022)  Housing: Low Risk  (05/31/2022)  Transportation Needs: No Transportation Needs (05/31/2022)  Utilities: Not At Risk (05/31/2022)  Tobacco Use: Medium Risk (05/30/2022)   SDOH Interventions:     Readmission Risk  Interventions     No data to display

## 2022-05-31 NOTE — Progress Notes (Signed)
PROGRESS NOTE  Jaime Blackburn  DOB: [birth year]  PCP: [facility] [id number]  DOA: 05/30/2022  LOS: 1 day  Hospital Day: 2  Brief narrative: Jaime Blackburn is a 77 y.o. male with PMH significant for HTN, HLD, aortic aneurysm, BPH, arthritis  2/1, patient was seen by her PCP w with complaint of cough and night sweats for 3 to 4 weeks.  CT chest abdomen pelvis was obtained which showed right lower lobe and abscess and right middle lobe consolidation with a large central area of fluid attenuation consistent with necrotic pneumonia.  He was started on a 10-day course of moxifloxacin 400 mg daily despite which she continued to have symptoms.  2/8, he spoke to his PCPs office and was directed to the ED.  In the ED, patient was afebrile, hemodynamically stable Labs showed WBC count of 12, lactic acid level normal, procalcitonin level not elevated Urinalysis unremarkable Chest x-ray showed a stable focal opacification over the posterior right lower lobe likely consistent with reported infection/abscess. CT head unremarkable Blood culture was sent. Patient was started on IV antibiotics  Subjective: Patient was seen and examined this morning.  Elderly Caucasian male.  Propped up in bed.  Denies any symptoms today.   Confused, daughter at bedside. Chart reviewed No fever, heart rate in 90s, blood pressure 140s, breathing on room air Repeat labs this morning with resolution of leukocytosis to 5.9, sodium 134.  Assessment and plan: Lung abscess Diagnosed on 2/1 with CT chest obtained for 3 to 4 weeks of cough and night sweats. Did not improve as an outpatient with moxifloxacin Blood culture sent. Currently on IV cefepime, IV vancomycin and IV Flagyl Pulmonary consulted. No fever.  Labs this morning with improving WBC count. Recent Labs  Lab 05/30/22 1458 05/30/22 1600 05/31/22 0411  WBC 12.0*  --  5.9  LATICACIDVEN 1.1 1.0  --   PROCALCITON <0.10  --   --    Altered mental  status Likely due to underlying infection.  Probably has underlying dementia as well based on moderate chronic small vessel ischemic disease noted in CT scan.   Continue monitor mental status change.   Essential hypertension Lisinopril 20 mg daily   HLD atorvastatin 40 mg daily   BPH Flomax 0.4 mg daily as needed  Prolonged Qtc EKG on admission with QTc prolonged at 506 ms.  Normal electrolytes. Recent Labs  Lab 05/30/22 1458 05/31/22 0411  K 3.9 3.6  MG 2.0  --   PHOS 3.3  --    Thoracic aortic aneurysm CT chest 2/1 showed dilated ascending thoracic aorta to 4.5 cm, stable from the prior CT from 12/2021.  Recommend annual imaging followup by CTA or MRA.   Mobility: Encourage ambulation.  PT eval ordered  Goals of care   Code Status: Full Code     DVT prophylaxis:  heparin injection 5,000 Units Start: 05/30/22 1715 Place TED hose Start: 05/30/22 1704   Antimicrobials: IV cefepime, IV Flagyl, IV vancomycin Fluid: NS at 125 mill per hour Consultants: Pulmonology Family Communication: Daughter at bedside  Status is: Inpatient Level of care: Telemetry Cardiac   Dispo: Patient is from: Home              Anticipated d/c is to: Pending clinical course Continue in-hospital care because: On IV antibiotics.  Remains altered   Scheduled Meds:  atorvastatin  40 mg Oral q1800   heparin  5,000 Units Subcutaneous Q8H   lisinopril  20 mg Oral  Daily   tamsulosin  0.4 mg Oral QPC breakfast    PRN meds: acetaminophen **OR** acetaminophen, hydrALAZINE, melatonin, ondansetron **OR** ondansetron (ZOFRAN) IV, senna-docusate   Infusions:   sodium chloride 125 mL/hr at 05/31/22 1111   ceFEPime (MAXIPIME) IV 2 g (05/31/22 1333)   metronidazole Stopped (05/31/22 0518)   vancomycin 1,000 mg (05/31/22 0646)    Diet:  Diet Order             Diet Heart Room service appropriate? Yes; Fluid consistency: Thin  Diet effective now                    Antimicrobials: Anti-infectives (From admission, onward)    Start     Dose/Rate Route Frequency Ordered Stop   05/31/22 0600  vancomycin (VANCOCIN) IVPB 1000 mg/200 mL premix       See Hyperspace for full Linked Orders Report.   1,000 mg 200 mL/hr over 60 Minutes Intravenous Every 12 hours 05/30/22 1735     05/30/22 2200  ceFEPIme (MAXIPIME) 2 g in sodium chloride 0.9 % 100 mL IVPB        2 g 200 mL/hr over 30 Minutes Intravenous Every 8 hours 05/30/22 1735     05/30/22 1800  vancomycin (VANCOREADY) IVPB 2000 mg/400 mL       See Hyperspace for full Linked Orders Report.   2,000 mg 200 mL/hr over 120 Minutes Intravenous  Once 05/30/22 1735 05/30/22 2143   05/30/22 1715  metroNIDAZOLE (FLAGYL) IVPB 500 mg        500 mg 100 mL/hr over 60 Minutes Intravenous Every 12 hours 05/30/22 1710 06/06/22 1714   05/30/22 1615  clindamycin (CLEOCIN) IVPB 900 mg  Status:  Discontinued        900 mg 100 mL/hr over 30 Minutes Intravenous  Once 05/30/22 1601 05/30/22 1705   05/30/22 1615  cefTRIAXone (ROCEPHIN) 2 g in sodium chloride 0.9 % 100 mL IVPB        2 g 200 mL/hr over 30 Minutes Intravenous  Once 05/30/22 1601 05/30/22 1716       Skin assessment:       Nutritional status:  Body mass index is 22.94 kg/m.          Objective: Vitals:   05/31/22 0835 05/31/22 1222  BP: (!) 154/81 139/70  Pulse: 82 84  Resp: 18 18  Temp: 98.1 F (36.7 C) 97.8 F (36.6 C)  SpO2: 99% 98%    Intake/Output Summary (Last 24 hours) at 05/31/2022 1335 Last data filed at 05/31/2022 [id number] Gross per 24 hour  Intake 1036.91 ml  Output 695 ml  Net 341.91 ml   Filed Weights   05/30/22 1444 05/30/22 2110  Weight: 77.6 kg 74.6 kg   Weight change:  Body mass index is 22.94 kg/m.   Physical Exam: General exam: Pleasant, elderly Caucasian male.  Not in pain Skin: No rashes, lesions or ulcers. HEENT: Atraumatic, normocephalic, no obvious bleeding Lungs: Clear to auscultation bilaterally.   Coughs on deep breathing CVS: Regular rate and rhythm, no murmur GI/Abd soft, nontender, nondistended, bowel sound present CNS: Alert, awake, oriented x place and person only.  Forgetful. Psychiatry: Mood appropriate Extremities: No pedal edema, no calf tenderness  Data Review: I have personally reviewed the laboratory data and studies available.  F/u labs ordered Unresulted Labs (From admission, onward)    None       Total time spent in review of labs and imaging, patient evaluation, formulation of plan,  documentation and communication with family: 75 minutes  Signed, Terrilee Croak, MD [facility] 05/31/2022

## 2022-05-31 NOTE — Plan of Care (Signed)

## 2022-05-31 NOTE — Progress Notes (Signed)
Pt is requesting a private room. Notified incoming shift. Will continue to monitor.

## 2022-05-31 NOTE — Consult Note (Addendum)
PULMONOLOGY         Date: 05/31/2022,   MRN# [id number] Jaime Blackburn 07-Aug-1945     AdmissionWeight: 77.6 kg                 CurrentWeight: 74.6 kg  Referring provider: Dr Pietro Cassis   CHIEF COMPLAINT:   Right lower and middle lobe consolidation with central area of fluid collection.    HISTORY OF PRESENT ILLNESS   This is 77 year old male with a history of osteoarthritis essential hypertension, dyslipidemia, BPH, aortic aneurysm initially came in with chief complaint of altered mental status and was noted to have left lower lung pneumonia.  Further imaging as concerning for abscess.  In the ED he was tachycardic and tachypneic but remained on room air.  Renal function was normal on admission and he did have leukocytosis.  Initially received Rocephin 2 g and clindamycin.  He endorses progressively worsening cough over the last 1 month.  Denies GI symptoms.  He was diagnosed with pneumonia and possible empyema also had Flagyl ordered with 500 mg IV twice daily dosing.  MRSA PCR was positive and vancomycin was also added.  He did have blood cultures performed and procalcitonin is trending. Denies chills, fevers, hemoptysis, denies dental pain. Patient is on room air and speaks in full sentences, denies shortness of breath and denies chest discomfort.  He asks if its possible to complete treatment at home so he can take care of dog.  He was able to get up out of bed and walk over to computer to review chest imaging with me.  He asked multiple questions regarding findings and care path as well as any surgery that may be necessary.  Pulmonary consultation was placed for additional evaluation and management.   PAST MEDICAL HISTORY   Past Medical History:  Diagnosis Date   Arthritis    Essential hypertension 05/30/2022     SURGICAL HISTORY   Past Surgical History:  Procedure Laterality Date   FINGER AMPUTATION     FOOT SURGERY       FAMILY HISTORY   Family History  Problem  Relation Age of Onset   Colon cancer Mother    Stroke Mother    Tuberculosis Father    Stroke Maternal Grandmother    Lung cancer Paternal Grandfather      SOCIAL HISTORY   Social History   Tobacco Use   Smoking status: Former    Packs/day: 1.00    Years: 30.00    Total pack years: 30.00    Types: Cigarettes    Quit date: 01/16/2012    Years since quitting: 10.3   Smokeless tobacco: Never  Vaping Use   Vaping Use: Never used  Substance Use Topics   Alcohol use: Yes    Alcohol/week: 14.0 standard drinks of alcohol    Types: 14 Shots of liquor per week    Comment: 2 shots daily    Drug use: Never     MEDICATIONS    Home Medication:    Current Medication:  Current Facility-Administered Medications:    0.9 %  sodium chloride infusion, , Intravenous, Continuous, Cox, Amy N, DO, Last Rate: 125 mL/hr at 05/31/22 1111, New Bag at 05/31/22 1111   acetaminophen (TYLENOL) tablet 650 mg, 650 mg, Oral, Q6H PRN, 650 mg at 05/30/22 2242 **OR** acetaminophen (TYLENOL) suppository 650 mg, 650 mg, Rectal, Q6H PRN, Cox, Amy N, DO   atorvastatin (LIPITOR) tablet 40 mg, 40 mg, Oral, q1800, Dahal, Binaya,  MD   ceFEPIme (MAXIPIME) 2 g in sodium chloride 0.9 % 100 mL IVPB, 2 g, Intravenous, Q8H, Oswald Hillock, RPH, Stopped at 05/31/22 [id number]   heparin injection 5,000 Units, 5,000 Units, Subcutaneous, Q8H, Cox, Amy N, DO, 5,000 Units at 05/31/22 0540   hydrALAZINE (APRESOLINE) injection 5 mg, 5 mg, Intravenous, Q8H PRN, Cox, Amy N, DO   lisinopril (ZESTRIL) tablet 20 mg, 20 mg, Oral, Daily, Dahal, Binaya, MD   melatonin tablet 5 mg, 5 mg, Oral, QHS PRN, Cox, Amy N, DO, 5 mg at 05/30/22 2242   metroNIDAZOLE (FLAGYL) IVPB 500 mg, 500 mg, Intravenous, Q12H, Cox, Amy N, DO, Stopped at 05/31/22 0518   ondansetron (ZOFRAN) tablet 4 mg, 4 mg, Oral, Q6H PRN **OR** ondansetron (ZOFRAN) injection 4 mg, 4 mg, Intravenous, Q6H PRN, Cox, Amy N, DO   senna-docusate (Senokot-S) tablet 1 tablet, 1 tablet,  Oral, QHS PRN, Cox, Amy N, DO   tamsulosin (FLOMAX) capsule 0.4 mg, 0.4 mg, Oral, QPC breakfast, Dahal, Binaya, MD   [COMPLETED] vancomycin (VANCOREADY) IVPB 2000 mg/400 mL, 2,000 mg, Intravenous, Once, Stopped at 05/30/22 2143 **FOLLOWED BY** vancomycin (VANCOCIN) IVPB 1000 mg/200 mL premix, 1,000 mg, Intravenous, Q12H, Oswald Hillock, RPH, Last Rate: 200 mL/hr at 05/31/22 0646, 1,000 mg at 05/31/22 0646    ALLERGIES   Propoxyphene     REVIEW OF SYSTEMS    Review of Systems:  Gen:  Denies  fever, sweats, chills weigh loss  HEENT: Denies blurred vision, double vision, ear pain, eye pain, hearing loss, nose bleeds, sore throat Cardiac:  No dizziness, chest pain or heaviness, chest tightness,edema Resp:   reports dyspnea chronically  Gi: Denies swallowing difficulty, stomach pain, nausea or vomiting, diarrhea, constipation, bowel incontinence Gu:  Denies bladder incontinence, burning urine Ext:   Denies Joint pain, stiffness or swelling Skin: Denies  skin rash, easy bruising or bleeding or hives Endoc:  Denies polyuria, polydipsia , polyphagia or weight change Psych:   Denies depression, insomnia or hallucinations   Other:  All other systems negative   VS: BP (!) 154/81 (BP Location: Left Arm)   Pulse 82   Temp 98.1 F (36.7 C)   Resp 18   Ht 5' 11"$  (1.803 m)   Wt 74.6 kg   SpO2 99%   BMI 22.94 kg/m      PHYSICAL EXAM    GENERAL:NAD, no fevers, chills, no weakness no fatigue HEAD: Normocephalic, atraumatic.  EYES: Pupils equal, round, reactive to light. Extraocular muscles intact. No scleral icterus.  MOUTH: Moist mucosal membrane. Dentition intact. No abscess noted.  EAR, NOSE, THROAT: Clear without exudates. No external lesions.  NECK: Supple. No thyromegaly. No nodules. No JVD.  PULMONARY: decreased breath sounds with mild rhonchi worse at bases bilaterally.  CARDIOVASCULAR: S1 and S2. Regular rate and rhythm. No murmurs, rubs, or gallops. No edema. Pedal  pulses 2+ bilaterally.  GASTROINTESTINAL: Soft, nontender, nondistended. No masses. Positive bowel sounds. No hepatosplenomegaly.  MUSCULOSKELETAL: No swelling, clubbing, or edema. Range of motion full in all extremities.  NEUROLOGIC: Cranial nerves II through XII are intact. No gross focal neurological deficits. Sensation intact. Reflexes intact.  SKIN: No ulceration, lesions, rashes, or cyanosis. Skin warm and dry. Turgor intact.  PSYCHIATRIC: Mood, affect within normal limits. The patient is awake, alert and oriented x 3. Insight, judgment intact.       IMAGING     Narrative & Impression  CLINICAL DATA:  Cough and night sweats for 3-4 weeks.   EXAM: CT CHEST,  ABDOMEN, AND PELVIS WITH CONTRAST   TECHNIQUE: Multidetector CT imaging of the chest, abdomen and pelvis was performed following the standard protocol during bolus administration of intravenous contrast.   RADIATION DOSE REDUCTION: This exam was performed according to the departmental dose-optimization program which includes automated exposure control, adjustment of the mA and/or kV according to patient size and/or use of iterative reconstruction technique.   CONTRAST:  67m OMNIPAQUE IOHEXOL 350 MG/ML SOLN   COMPARISON:  Chest CT, 01/10/2022.   FINDINGS: CT CHEST FINDINGS   Cardiovascular: Heart is normal in size and configuration. Three-vessel coronary artery calcifications. No pericardial effusion. 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 Stable aortic atherosclerosis. No dissection or significant stenosis.   Mediastinum/Nodes: No mediastinal or hilar masses. Prominent to mildly enlarged mediastinal lymph nodes. Ascus level right paratracheal node measures 1.1 cm short axis. Subcarinal node measures 1.3 cm in short axis, both larger than on the prior CT. Trachea and esophagus are unremarkable.   Lungs/Pleura: Fluid collection with surrounding lung consolidation lies in  the right lower lobe posterolaterally, overall abnormality spanning 10.6 x 6.0 x 9.4 cm. Fluid collection measuring approximately 9.4 x 4.2 x 7.1 cm. There are smaller adjacent fluid collections and patchy consolidation in the lateral right lower lobe and posterolateral right middle lobe.   No other areas of consolidation. Mild peripheral areas of reticulation are noted most evident in the base of the right middle lobe, similar to the prior CT. Mild apical pleuroparenchymal scarring is stable.   No evidence of pulmonary edema. No left pleural effusion and no pneumothorax.   Musculoskeletal: No fracture or bone lesion.  No chest wall mass.   CT ABDOMEN PELVIS FINDINGS   Hepatobiliary: No focal liver abnormality is seen. No gallstones, gallbladder wall thickening, or biliary dilatation.   Pancreas: Unremarkable. No pancreatic ductal dilatation or surrounding inflammatory changes.   Spleen: Normal in size without focal abnormality.   Adrenals/Urinary Tract: Adrenal glands are unremarkable. Kidneys are normal, without renal calculi, focal lesion, or hydronephrosis. Bladder is unremarkable.   Stomach/Bowel: Normal stomach. Small bowel and colon are normal in caliber. No wall thickening. No inflammation. Numerous sigmoid colon diverticula. No diverticulitis. Normal appendix.   Vascular/Lymphatic: Aortic atherosclerosis. No aneurysm. No enlarged lymph nodes.   Reproductive: Unremarkable.   Other: No abdominal wall hernia or abnormality. No abdominopelvic ascites.   Musculoskeletal: No fracture or acute finding. No bone lesion. Degenerative changes of the spine. Lumbar levoscoliosis. Advanced arthropathic changes of the right hip.   IMPRESSION: 1. Right lower lobe and adjacent right middle lobe consolidation with a large central area of fluid attenuation consistent with necrotic pneumonia. This is new when compared to the prior chest CT from 01/10/2022. 2. Mild mediastinal  lymphadenopathy suspected to be reactive. 3. No other acute abnormality within the chest, abdomen or pelvis. 4. 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 Recommend annual imaging followup by CTA or MRA. This recommendation follows 2010 ACCF/AHA/AATS/ACR/ASA/SCA/SCAI/SIR/STS/SVM Guidelines for the Diagnosis and Management of Patients with Thoracic Aortic Disease. Circulation. 2010; 121:[id number] Aortic aneurysm NOS (ICD10-I71.9) 5. Coronary artery calcifications and aortic atherosclerosis.     Electronically Signed   By: DLajean ManesM.D.   On: 05/24/2022 08:44       ASSESSMENT/PLAN   Right lower lobe and right middle lobe consolidation with fluid collection     -patient with MRSA + please continue Vancomycin     - no obvious effusion/empyema     -  continue cefepime while in patient for pseudomonal coverage     -continue Flagyl  500BID poor dentitiooon     -lung abscess at right lower lobe may be secondary to aspiration , would be prudent to obtain SLP while inpatient.     -Friend of family was at bedside reports possible dementia and cognitive impairment     -patient share he is unable to produce any respiratory cultures due to absence of cough/phlegm currently    -Patient has been smoking from age 37 to 66 with total 50 pack year smoking, he endorses weight loss and there is some concern regarding primary bronchogenic carcinoma.  Procalcitonin is low <0.10 pointing away from LRTI.          Additional microbiology ordered:     -resp gram stain and culture   -strep pneumonia ur AG   - Legionella AB   -possible septic embolization - blood cultures collected and NGT   -Patient denies IVDA   -have discussed potential bronchoscopic evaluation if not improved post antibiotic therapy    - patient would like to be discharged home and continue antibiotics at home with outpatient follow up.  I think this is reasonable after completion of above workup since he  is stable on room air.  Ill order PT/OT to establish safety for DC home    -Aspergillus ag - vein  - Fungitell serology   - cryptococcal ag   - histoplasma galactomanan                Thank you for allowing me to participate in the care of this patient.   Patient/Family are satisfied with care plan and all questions have been answered.    Provider disclosure: Patient with at least one acute or chronic illness or injury that poses a threat to life or bodily function and is being managed actively during this encounter.  All of the below services have been performed independently by signing provider:  review of prior documentation from internal and or external health records.  Review of previous and current lab results.  Interview and comprehensive assessment during patient visit today. Review of current and previous chest radiographs/CT scans. Discussion of management and test interpretation with health care team and patient/family.   This document was prepared using Dragon voice recognition software and may include unintentional dictation errors.     Ottie Glazier, M.D.  Division of Pulmonary & Critical Care Medicine

## 2022-05-31 NOTE — Consult Note (Addendum)
Pharmacy Antibiotic Note  Jaime Blackburn is a 77 y.o. male admitted on 05/30/2022 with pneumonia.  Pharmacy has been consulted for vancomycin dosing. Concern for abscess.   Patient relevant history: 05/23/2022: Patient was diagnosed with right lower lobe and adjacent right middle lobe consolidation with large central area of fluid attenuation consistent with necrotic pneumonia/lung abscess   05/28/2022: Patient was prescribed moxifloxacin 400 mg daily for 10 days.  Referral to outpatient pulmonology specialist was placed.  Plan: Continue vancomycin 1000 mg q12H for a predicted AUC of 477.  Goal AUC of 400-600. Scr 0.8, OBW, Vd 0.72.  Plan to order vancomycin levels after 4th or 5th dose.   Continue cefepime 2 g q8H per discussion with MD.   Continue flagyl 500 mg BID   *Waiting for pulmonologist consult prior to de-escalate Abx therapy*  Height: 5' 11"$  (180.3 cm) Weight: 74.6 kg (164 lb 7.4 oz) IBW/kg (Calculated) : 75.3  Temp (24hrs), Avg:97.9 F (36.6 C), Min:97.7 F (36.5 C), Max:98.1 F (36.7 C)  Recent Labs  Lab 05/30/22 1458 05/30/22 1600 05/31/22 0411  WBC 12.0*  --  5.9  CREATININE 0.70  --  0.58*  LATICACIDVEN 1.1 1.0  --      Estimated Creatinine Clearance: 84.2 mL/min (A) (by C-G formula based on SCr of 0.58 mg/dL (L)).    Allergies  Allergen Reactions   Propoxyphene     Other reaction(s): Other (See Comments)    Antimicrobials this admission: 2/8 ceftriaxone x 1.  2/8 vancomycin  >>  2/8 cefepime >>  2/8 metronidazole >>  Dose adjustments this admission:None  Microbiology results: 2/8 BCx: pending 2/8 MRSA PCR: non-reactive  Thank you for allowing pharmacy to be a part of this patient's care.  Maila Dukes Rodriguez-Guzman PharmD, BCPS 05/31/2022 8:09 AM

## 2022-06-01 DIAGNOSIS — J869 Pyothorax without fistula: Secondary | ICD-10-CM | POA: Diagnosis not present

## 2022-06-01 DIAGNOSIS — E43 Unspecified severe protein-calorie malnutrition: Secondary | ICD-10-CM | POA: Insufficient documentation

## 2022-06-01 LAB — STREP PNEUMONIAE URINARY ANTIGEN: Strep Pneumo Urinary Antigen: NEGATIVE

## 2022-06-01 MED ORDER — QUETIAPINE FUMARATE 25 MG PO TABS: 25.0000 mg | ORAL_TABLET | Freq: Two times a day (BID) | ORAL | 0 refills | Status: AC | PRN

## 2022-06-01 MED ORDER — METRONIDAZOLE 500 MG PO TABS: 500.0000 mg | ORAL_TABLET | Freq: Three times a day (TID) | ORAL | 0 refills | Status: AC

## 2022-06-01 MED ORDER — MELATONIN 5 MG PO TABS: 5.0000 mg | ORAL_TABLET | Freq: Every day | ORAL | 0 refills | Status: AC

## 2022-06-01 MED ORDER — AMOXICILLIN-POT CLAVULANATE 875-125 MG PO TABS: 1.0000 | ORAL_TABLET | Freq: Two times a day (BID) | ORAL | 0 refills | Status: AC

## 2022-06-01 NOTE — Progress Notes (Signed)
PULMONOLOGY         Date: 06/01/2022,   MRN# [id number] Jaime Blackburn May 26, 1945     AdmissionWeight: 77.6 kg                 CurrentWeight: 72.3 kg  Referring provider: Dr Pietro Cassis   CHIEF COMPLAINT:   Right lower and middle lobe consolidation with central area of fluid collection.    HISTORY OF PRESENT ILLNESS   This is 77 year old male with a history of osteoarthritis essential hypertension, dyslipidemia, BPH, aortic aneurysm initially came in with chief complaint of altered mental status and was noted to have left lower lung pneumonia.  Further imaging as concerning for abscess.  In the ED he was tachycardic and tachypneic but remained on room air.  Renal function was normal on admission and he did have leukocytosis.  Initially received Rocephin 2 g and clindamycin.  He endorses progressively worsening cough over the last 1 month.  Denies GI symptoms.  He was diagnosed with pneumonia and possible empyema also had Flagyl ordered with 500 mg IV twice daily dosing.  MRSA PCR was positive and vancomycin was also added.  He did have blood cultures performed and procalcitonin is trending. Denies chills, fevers, hemoptysis, denies dental pain. Patient is on room air and speaks in full sentences, denies shortness of breath and denies chest discomfort.  He asks if its possible to complete treatment at home so he can take care of dog.  He was able to get up out of bed and walk over to computer to review chest imaging with me.  He asked multiple questions regarding findings and care path as well as any surgery that may be necessary.  Pulmonary consultation was placed for additional evaluation and management.   06/01/22-patient was seen and examined at bedside.  He is calm , oriented x3 on room air stable vital signs, denies dyspnea or chest discomfort able to speak in full sentences with non-labored breathing. He is currently on cefepime and flagyl.  Since he is doing well and requests DC home,  I think we can transition to PO regimen with Flagyl and augmentin.  He had SLP with high aspiration risk which may be culprit of lung abscess. He needs to follow up on outpatient for interval chest imaging to confirm improvement.  There still is concern for underlying neoplasm related lesion.   PAST MEDICAL HISTORY   Past Medical History:  Diagnosis Date   Arthritis    Essential hypertension 05/30/2022     SURGICAL HISTORY   Past Surgical History:  Procedure Laterality Date   FINGER AMPUTATION     FOOT SURGERY       FAMILY HISTORY   Family History  Problem Relation Age of Onset   Colon cancer Mother    Stroke Mother    Tuberculosis Father    Stroke Maternal Grandmother    Lung cancer Paternal Grandfather      SOCIAL HISTORY   Social History   Tobacco Use   Smoking status: Former    Packs/day: 1.00    Years: 30.00    Total pack years: 30.00    Types: Cigarettes    Quit date: 01/16/2012    Years since quitting: 10.3   Smokeless tobacco: Never  Vaping Use   Vaping Use: Never used  Substance Use Topics   Alcohol use: Yes    Alcohol/week: 14.0 standard drinks of alcohol    Types: 14 Shots of liquor per week  Comment: 2 shots daily    Drug use: Never     MEDICATIONS    Home Medication:    Current Medication:  Current Facility-Administered Medications:    acetaminophen (TYLENOL) tablet 650 mg, 650 mg, Oral, Q6H PRN, 650 mg at 05/30/22 2242 **OR** acetaminophen (TYLENOL) suppository 650 mg, 650 mg, Rectal, Q6H PRN, Cox, Amy N, DO   atorvastatin (LIPITOR) tablet 40 mg, 40 mg, Oral, q1800, Dahal, Marlowe Aschoff, MD, 40 mg at 05/31/22 1732   ceFEPIme (MAXIPIME) 2 g in sodium chloride 0.9 % 100 mL IVPB, 2 g, Intravenous, Q8H, Oswald Hillock, RPH, Last Rate: 200 mL/hr at 06/01/22 0656, 2 g at 06/01/22 0656   feeding supplement (ENSURE ENLIVE / ENSURE PLUS) liquid 237 mL, 237 mL, Oral, BID BM, Dahal, Binaya, MD, 237 mL at 06/01/22 0808   heparin injection 5,000 Units,  5,000 Units, Subcutaneous, Q8H, Cox, Amy N, DO, 5,000 Units at 06/01/22 0548   hydrALAZINE (APRESOLINE) injection 5 mg, 5 mg, Intravenous, Q8H PRN, Cox, Amy N, DO   lisinopril (ZESTRIL) tablet 20 mg, 20 mg, Oral, Daily, Dahal, Binaya, MD, 20 mg at 06/01/22 [id number]   melatonin tablet 5 mg, 5 mg, Oral, QHS, Dahal, Binaya, MD, 5 mg at 05/31/22 2126   metroNIDAZOLE (FLAGYL) IVPB 500 mg, 500 mg, Intravenous, Q12H, Cox, Amy N, DO, Last Rate: 100 mL/hr at 06/01/22 0553, 500 mg at 06/01/22 0553   multivitamin with minerals tablet 1 tablet, 1 tablet, Oral, Daily, Dahal, Binaya, MD, 1 tablet at 06/01/22 0807   ondansetron (ZOFRAN) tablet 4 mg, 4 mg, Oral, Q6H PRN **OR** ondansetron (ZOFRAN) injection 4 mg, 4 mg, Intravenous, Q6H PRN, Cox, Amy N, DO   QUEtiapine (SEROQUEL) tablet 25 mg, 25 mg, Oral, BID, Dahal, Binaya, MD, 25 mg at 06/01/22 0807   senna-docusate (Senokot-S) tablet 1 tablet, 1 tablet, Oral, QHS PRN, Cox, Amy N, DO   tamsulosin (FLOMAX) capsule 0.4 mg, 0.4 mg, Oral, QPC breakfast, Dahal, Binaya, MD, 0.4 mg at 06/01/22 0807    ALLERGIES   Propoxyphene     REVIEW OF SYSTEMS    Review of Systems:  Gen:  Denies  fever, sweats, chills weigh loss  HEENT: Denies blurred vision, double vision, ear pain, eye pain, hearing loss, nose bleeds, sore throat Cardiac:  No dizziness, chest pain or heaviness, chest tightness,edema Resp:   reports dyspnea chronically  Gi: Denies swallowing difficulty, stomach pain, nausea or vomiting, diarrhea, constipation, bowel incontinence Gu:  Denies bladder incontinence, burning urine Ext:   Denies Joint pain, stiffness or swelling Skin: Denies  skin rash, easy bruising or bleeding or hives Endoc:  Denies polyuria, polydipsia , polyphagia or weight change Psych:   Denies depression, insomnia or hallucinations   Other:  All other systems negative   VS: BP 136/82 (BP Location: Right Arm)   Pulse 91   Temp 97.8 F (36.6 C)   Resp 18   Ht 5' 11"$  (1.803 m)    Wt 72.3 kg   SpO2 98%   BMI 22.23 kg/m      PHYSICAL EXAM    GENERAL:NAD, no fevers, chills, no weakness no fatigue HEAD: Normocephalic, atraumatic.  EYES: Pupils equal, round, reactive to light. Extraocular muscles intact. No scleral icterus.  MOUTH: Moist mucosal membrane. Dentition intact. No abscess noted.  EAR, NOSE, THROAT: Clear without exudates. No external lesions.  NECK: Supple. No thyromegaly. No nodules. No JVD.  PULMONARY: decreased breath sounds with mild rhonchi worse at bases bilaterally.  CARDIOVASCULAR: S1 and S2. Regular  rate and rhythm. No murmurs, rubs, or gallops. No edema. Pedal pulses 2+ bilaterally.  GASTROINTESTINAL: Soft, nontender, nondistended. No masses. Positive bowel sounds. No hepatosplenomegaly.  MUSCULOSKELETAL: No swelling, clubbing, or edema. Range of motion full in all extremities.  NEUROLOGIC: Cranial nerves II through XII are intact. No gross focal neurological deficits. Sensation intact. Reflexes intact.  SKIN: No ulceration, lesions, rashes, or cyanosis. Skin warm and dry. Turgor intact.  PSYCHIATRIC: Mood, affect within normal limits. The patient is awake, alert and oriented x 3. Insight, judgment intact.       IMAGING     Narrative & Impression  CLINICAL DATA:  Cough and night sweats for 3-4 weeks.   EXAM: CT CHEST, ABDOMEN, AND PELVIS WITH CONTRAST   TECHNIQUE: Multidetector CT imaging of the chest, abdomen and pelvis was performed following the standard protocol during bolus administration of intravenous contrast.   RADIATION DOSE REDUCTION: This exam was performed according to the departmental dose-optimization program which includes automated exposure control, adjustment of the mA and/or kV according to patient size and/or use of iterative reconstruction technique.   CONTRAST:  43m OMNIPAQUE IOHEXOL 350 MG/ML SOLN   COMPARISON:  Chest CT, 01/10/2022.   FINDINGS: CT CHEST FINDINGS   Cardiovascular: Heart is  normal in size and configuration. Three-vessel coronary artery calcifications. No pericardial effusion. 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 Stable aortic atherosclerosis. No dissection or significant stenosis.   Mediastinum/Nodes: No mediastinal or hilar masses. Prominent to mildly enlarged mediastinal lymph nodes. Ascus level right paratracheal node measures 1.1 cm short axis. Subcarinal node measures 1.3 cm in short axis, both larger than on the prior CT. Trachea and esophagus are unremarkable.   Lungs/Pleura: Fluid collection with surrounding lung consolidation lies in the right lower lobe posterolaterally, overall abnormality spanning 10.6 x 6.0 x 9.4 cm. Fluid collection measuring approximately 9.4 x 4.2 x 7.1 cm. There are smaller adjacent fluid collections and patchy consolidation in the lateral right lower lobe and posterolateral right middle lobe.   No other areas of consolidation. Mild peripheral areas of reticulation are noted most evident in the base of the right middle lobe, similar to the prior CT. Mild apical pleuroparenchymal scarring is stable.   No evidence of pulmonary edema. No left pleural effusion and no pneumothorax.   Musculoskeletal: No fracture or bone lesion.  No chest wall mass.   CT ABDOMEN PELVIS FINDINGS   Hepatobiliary: No focal liver abnormality is seen. No gallstones, gallbladder wall thickening, or biliary dilatation.   Pancreas: Unremarkable. No pancreatic ductal dilatation or surrounding inflammatory changes.   Spleen: Normal in size without focal abnormality.   Adrenals/Urinary Tract: Adrenal glands are unremarkable. Kidneys are normal, without renal calculi, focal lesion, or hydronephrosis. Bladder is unremarkable.   Stomach/Bowel: Normal stomach. Small bowel and colon are normal in caliber. No wall thickening. No inflammation. Numerous sigmoid colon diverticula. No diverticulitis. Normal  appendix.   Vascular/Lymphatic: Aortic atherosclerosis. No aneurysm. No enlarged lymph nodes.   Reproductive: Unremarkable.   Other: No abdominal wall hernia or abnormality. No abdominopelvic ascites.   Musculoskeletal: No fracture or acute finding. No bone lesion. Degenerative changes of the spine. Lumbar levoscoliosis. Advanced arthropathic changes of the right hip.   IMPRESSION: 1. Right lower lobe and adjacent right middle lobe consolidation with a large central area of fluid attenuation consistent with necrotic pneumonia. This is new when compared to the prior chest CT from 01/10/2022. 2. Mild mediastinal lymphadenopathy suspected to be reactive.  3. No other acute abnormality within the chest, abdomen or pelvis. 4. 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 Recommend annual imaging followup by CTA or MRA. This recommendation follows 2010 ACCF/AHA/AATS/ACR/ASA/SCA/SCAI/SIR/STS/SVM Guidelines for the Diagnosis and Management of Patients with Thoracic Aortic Disease. Circulation. 2010; [id number]. Aortic aneurysm NOS (ICD10-I71.9) 5. Coronary artery calcifications and aortic atherosclerosis.     Electronically Signed   By: Lajean Manes M.D.   On: 05/24/2022 08:44       ASSESSMENT/PLAN   Right lower lobe and right middle lobe consolidation with fluid collection     -patient with MRSA + please continue Vancomycin     - no obvious effusion/empyema     -continue cefepime while in patient for pseudomonal coverage     -continue Flagyl  500BID poor dentitiooon     -lung abscess at right lower lobe may be secondary to aspiration , would be prudent to obtain SLP while inpatient.     -Friend of family was at bedside reports possible dementia and cognitive impairment     -patient share he is unable to produce any respiratory cultures due to absence of cough/phlegm currently    -Patient has been smoking from age 64 to 55 with total 50 pack year  smoking, he endorses weight loss and there is some concern regarding primary bronchogenic carcinoma.  Procalcitonin is low <0.10 pointing away from LRTI.          Additional microbiology ordered:     -resp gram stain and culture   -strep pneumonia ur AG   - Legionella AB   -possible septic embolization - blood cultures collected and NGT   -Patient denies IVDA   -have discussed potential bronchoscopic evaluation if not improved post antibiotic therapy    - patient would like to be discharged home - he is cleared for dc home on augmentin 875/125 BID and Flagyl 500 tid for 14 days and have pulmonary follow up with [facility] pulmonology within 2 wk.  If he is unable to make it to [facility] pulmonology please go to any other nearby provider for follow up.  If patient requires hospitalization post discharge I advise to seek a clinical site that has thoracic surgery service such as [facility] in [place] or [facility] in [place] order PT/OT to establish safety for DC home    -Aspergillus ag - vein  - Fungitell serology   - cryptococcal ag   - histoplasma galactomanan                Thank you for allowing me to participate in the care of this patient.   Patient/Family are satisfied with care plan and all questions have been answered.    Provider disclosure: Patient with at least one acute or chronic illness or injury that poses a threat to life or bodily function and is being managed actively during this encounter.  All of the below services have been performed independently by signing provider:  review of prior documentation from internal and or external health records.  Review of previous and current lab results.  Interview and comprehensive assessment during patient visit today. Review of current and previous chest radiographs/CT scans. Discussion of management and test interpretation with health care team and patient/family.   This document was prepared using Dragon voice  recognition software and may include unintentional dictation errors.     Ottie Glazier, M.D.  Division of Pulmonary & Critical Care  Medicine

## 2022-06-01 NOTE — Discharge Summary (Signed)
Physician Discharge Summary  ERRON MANDALA [id number] DOB: Aug 27, 1945 DOA: 05/30/2022  PCP: [facility]  Admit date: 05/30/2022 Discharge date: 06/01/2022  Admitted From: home Discharge disposition: home  Recommendations at discharge:  Ensure compliance with antibiotics Follow-up with PCP as an outpatient   Brief narrative: OSMEL KRAVCHUK is a 77 y.o. male with PMH significant for HTN, HLD, aortic aneurysm, BPH, arthritis  2/1, patient was seen by her PCP w with complaint of cough and night sweats for 3 to 4 weeks.  CT chest abdomen pelvis was obtained which showed right lower lobe and abscess and right middle lobe consolidation with a large central area of fluid attenuation consistent with necrotic pneumonia.  He was started on a 10-day course of moxifloxacin 400 mg daily despite which she continued to have symptoms.  2/8, he spoke to his PCPs office and was directed to the ED.  In the ED, patient was afebrile, hemodynamically stable Labs showed WBC count of 12, lactic acid level normal, procalcitonin level not elevated Urinalysis unremarkable Chest x-ray showed a stable focal opacification over the posterior right lower lobe likely consistent with reported infection/abscess. CT head unremarkable Blood culture was sent. Patient was started on IV antibiotics  Subjective: Patient was seen and examined this morning.  Elderly Caucasian male.  Propped up in bed.  Denies any symptoms today.   Oriented x 3.  Able to have a normal conversation.  Daughter at bedside. Patient and daughter are both agreeable for discharge today.  Hospital course: Lung abscess Diagnosed on 2/1 with CT chest obtained for 3 to 4 weeks of cough and night sweats. Did not improve as an outpatient with moxifloxacin Blood culture sent. Chest x-ray showed a stable focal opacification over the posterior right lower lobe likely consistent with reported infection/abscess. Currently on IV cefepime, IV  vancomycin and IV Flagyl. Pulmonary consult was obtained. Clinically improved. Per pulmonary recommendation, will discharge the patient home on Augmentin 875/125 mg twice daily and Flagyl 500 mg 3 times daily for 14 days Patient to follow-up with pulmonology as an outpatient in 2 weeks Recent Labs  Lab 05/30/22 1458 05/30/22 1600 05/31/22 0411  WBC 12.0*  --  5.9  LATICACIDVEN 1.1 1.0  --   PROCALCITON <0.10  --   --    Altered mental status Likely due to underlying infection.  Probably has underlying dementia as well based on moderate chronic small vessel ischemic disease noted in CT scan.   Started on Seroquel 25 mg twice daily yesterday.  Mental status improved. Patient and family agreeable for discharge today.   Essential hypertension Lisinopril 20 mg daily   HLD atorvastatin 40 mg daily   BPH Flomax 0.4 mg daily as needed  Prolonged Qtc EKG on admission with QTc prolonged at 506 ms.  Normal electrolytes. Recent Labs  Lab 05/30/22 1458 05/31/22 0411  K 3.9 3.6  MG 2.0  --   PHOS 3.3  --    Thoracic aortic aneurysm CT chest 2/1 showed dilated ascending thoracic aorta to 4.5 cm, stable from the prior CT from 12/2021.  Recommend annual imaging followup by CTA or MRA.   Mobility: Encourage ambulation.  PT eval ordered  Goals of care   Code Status: Full Code   Wounds:  -    Discharge Exam:   Vitals:   06/01/22 0500 06/01/22 0541 06/01/22 0746 06/01/22 1153  BP:  130/84 136/82 (!) 148/93  Pulse:  89 91 89  Resp:  18  Temp:  97.8 F (36.6 C)    TempSrc:      SpO2:  96% 98% 97%  Weight: 72.3 kg     Height:        Body mass index is 22.23 kg/m.   General exam: Pleasant, elderly Caucasian male.  Not in pain Skin: No rashes, lesions or ulcers. HEENT: Atraumatic, normocephalic, no obvious bleeding Lungs: Clear to auscultation bilaterally.  Coughs on deep breathing CVS: Regular rate and rhythm, no murmur GI/Abd soft, nontender, nondistended, bowel  sound present CNS: Alert, awake, oriented x x 3. Psychiatry: Mood appropriate Extremities: No pedal edema, no calf tenderness  Follow ups:    Follow-up Information     [place] Follow up.   Contact information: [place] [id number] [phone]                 Discharge Instructions:   Discharge Instructions     Call MD for:  difficulty breathing, headache or visual disturbances   Complete by: As directed    Call MD for:  extreme fatigue   Complete by: As directed    Call MD for:  hives   Complete by: As directed    Call MD for:  persistant dizziness or light-headedness   Complete by: As directed    Call MD for:  persistant nausea and vomiting   Complete by: As directed    Call MD for:  severe uncontrolled pain   Complete by: As directed    Call MD for:  temperature >100.4   Complete by: As directed    Diet general   Complete by: As directed    Discharge instructions   Complete by: As directed    Recommendations at discharge:   Ensure compliance with antibiotics  Follow-up with PCP as an outpatient  General discharge instructions: Follow with Primary MD [place] in 7 days  Please request your PCP  to go over your hospital tests, procedures, radiology results at the follow up. Please get your medicines reviewed and adjusted.  Your PCP may decide to repeat certain labs or tests as needed. Do not drive, operate heavy machinery, perform activities at heights, swimming or participation in water activities or provide baby sitting services if your were admitted for syncope or siezures until you have seen by Primary MD or a Neurologist and advised to do so again. [place] Controlled Substance Reporting System database was reviewed. Do not drive, operate heavy machinery, perform activities at heights, swim, participate in water activities or provide baby-sitting services while on medications for pain, sleep and  mood until your outpatient physician has reevaluated you and advised to do so again.  You are strongly recommended to comply with the dose, frequency and duration of prescribed medications. Activity: As tolerated with Full fall precautions use walker/cane & assistance as needed Avoid using any recreational substances like cigarette, tobacco, alcohol, or non-prescribed drug. If you experience worsening of your admission symptoms, develop shortness of breath, life threatening emergency, suicidal or homicidal thoughts you must seek medical attention immediately by calling 911 or calling your MD immediately  if symptoms less severe. You must read complete instructions/literature along with all the possible adverse reactions/side effects for all the medicines you take and that have been prescribed to you. Take any new medicine only after you have completely understood and accepted all the possible adverse reactions/side effects.  Wear Seat belts while driving. You were cared for by a hospitalist  during your hospital stay. If you have any questions about your discharge medications or the care you received while you were in the hospital after you are discharged, you can call the unit and ask to speak with the hospitalist or the covering physician. Once you are discharged, your primary care physician will handle any further medical issues. Please note that NO REFILLS for any discharge medications will be authorized once you are discharged, as it is imperative that you return to your primary care physician (or establish a relationship with a primary care physician if you do not have one).   Increase activity slowly   Complete by: As directed        Discharge Medications:   Allergies as of 06/01/2022       Reactions   Propoxyphene Other (See Comments)   Other reaction(s): Other (See Comments) Other Reaction(s): Hallucinations        Medication List     STOP taking these medications    diclofenac  sodium 1 % Gel Commonly known as: VOLTAREN   fluticasone 50 MCG/ACT nasal spray Commonly known as: FLONASE   ivabradine 7.5 MG Tabs tablet Commonly known as: Corlanor   Lumigan 0.01 % Soln Generic drug: bimatoprost   meclizine 25 MG tablet Commonly known as: ANTIVERT   metoprolol tartrate 100 MG tablet Commonly known as: LOPRESSOR   moxifloxacin 400 MG tablet Commonly known as: AVELOX   nystatin ointment Commonly known as: MYCOSTATIN       TAKE these medications    amoxicillin-clavulanate 875-125 MG tablet Commonly known as: AUGMENTIN Take 1 tablet by mouth 2 (two) times daily for 14 days.   atorvastatin 40 MG tablet Commonly known as: LIPITOR Take 40 mg by mouth daily at 6 PM.   ibuprofen 200 MG tablet Commonly known as: ADVIL Take 200 mg by mouth at bedtime.   lisinopril 10 MG tablet Commonly known as: ZESTRIL Take 10 mg by mouth daily.   melatonin 5 MG Tabs Take 1 tablet (5 mg total) by mouth at bedtime.   metroNIDAZOLE 500 MG tablet Commonly known as: Flagyl Take 1 tablet (500 mg total) by mouth 3 (three) times daily for 14 days.   MULTIVITAMIN ADULT PO Take 1 tablet by mouth daily.   Omega 3 1200 MG Caps Take 1 capsule by mouth daily.   ondansetron 4 MG disintegrating tablet Commonly known as: ZOFRAN-ODT Take 1 tablet (4 mg total) by mouth every 8 (eight) hours as needed for nausea or vomiting.   QUEtiapine 25 MG tablet Commonly known as: SEROQUEL Take 1 tablet (25 mg total) by mouth 2 (two) times daily as needed.   tamsulosin 0.4 MG Caps capsule Commonly known as: FLOMAX Take 0.4 mg by mouth as needed.   TRAMADOL HCL PO Take by mouth.         The results of significant diagnostics from this hospitalization (including imaging, microbiology, ancillary and laboratory) are listed below for reference.    Procedures and Diagnostic Studies:   DG Swallowing Func-Speech Pathology  Result Date: 06/01/2022 Table formatting from the  original result was not included. Modified Barium Swallow Study Patient Details Name: LYNWARD BOURBON MRN: [id number] Date of Birth: October 08, 1945 Today's Date: 06/01/2022 HPI/PMH: HPI: This is 77 year old male with a history of osteoarthritis essential hypertension, dyslipidemia, BPH, aortic aneurysm initially came in with chief complaint of altered mental status and was noted to have left lower lung pneumonia.  Further imaging as concerning for abscess.  In the ED he was  tachycardic and tachypneic but remained on room air.  Renal function was normal on admission and he did have leukocytosis.  Initially received Rocephin 2 g and clindamycin.  He endorses progressively worsening cough over the last 1 month. Clinical Impression: Clinical Impression: Pt presents with adequate oropharyngeal abilities when consuming nectar thick liquids, thin liquids, puree, graham crackers and whole barium tablet with thin liquids. At this time, pt's risk of aspiration is considered low and suspect it is not a factor within current PNA. No further ST services are indicated at this time. Factors that may increase risk of adverse event in presence of aspiration ([place] 2021): Factors that may increase risk of adverse event in presence of aspiration ([place] 2021): Reduced cognitive function Recommendations/Plan: Swallowing Evaluation Recommendations Swallowing Evaluation Recommendations Recommendations: PO diet PO Diet Recommendation: Regular; Thin liquids (Level 0) Liquid Administration via: Cup Medication Administration: Whole meds with liquid Supervision: Patient able to self-feed Swallowing strategies  : Minimize environmental distractions; Slow rate; Small bites/sips Postural changes: Position pt fully upright for meals Oral care recommendations: Oral care BID (2x/day) Treatment Plan Treatment Plan Treatment recommendations: No treatment recommended at this time Follow-up recommendations: No SLP follow up Functional status  assessment: Patient has not had a recent decline in their functional status. Recommendations Recommendations for follow up therapy are one component of a multi-disciplinary discharge planning process, led by the attending physician.  Recommendations may be updated based on patient status, additional functional criteria and insurance authorization. Assessment: Orofacial Exam: Orofacial Exam Oral Cavity: Oral Hygiene: WFL Oral Cavity - Dentition: Adequate natural dentition Orofacial Anatomy: WFL Oral Motor/Sensory Function: WFL Anatomy: Anatomy: WFL Thin Liquids: Thin Liquids (Level 0) Thin Liquids : WFL  Mildly Thick Liquids: Mildly thick liquids (Level 2, nectar thick) Mildly thick liquids (Level 2, nectar thick): Not Tested  Moderately Thick Liquids: Moderately thick liquids (Level 3, honey thick) Moderately thick liquids (Level 3, honey thick): Not Tested  Puree: Puree Puree: WFL Solid: Solid Solid: WFL Pill: Pill Pill: WFL Consistency administered : thin Compensatory Strategies: Compensatory Strategies Compensatory strategies: No   General Information: Caregiver present: No  Diet Prior to this Study: Regular; Thin liquids (Level 0)   Temperature : Normal   Respiratory Status: WFL   Supplemental O2: None (Room air)   History of Recent Intubation: No  Behavior/Cognition: Alert; Cooperative; Pleasant mood; Requires cueing Self-Feeding Abilities: Able to self-feed Baseline vocal quality/speech: Normal Volitional Cough: Able to elicit Volitional Swallow: Able to elicit Exam Limitations: No limitations Goal Planning: No data recorded No data recorded No data recorded Patient/Family Stated Goal: none stated Consulted and agree with results and recommendations: Patient; Nurse; Physician (PUlmonologist) Pain: Pain Assessment Pain Assessment: No/denies pain End of Session: Start Time:SLP Start Time (ACUTE ONLY): 1518 Stop Time: SLP Stop Time (ACUTE ONLY): 1548 Time Calculation:SLP Time Calculation (min) (ACUTE ONLY): 30  min Charges: SLP Evaluations $ SLP Speech Visit: 1 Visit SLP Evaluations $MBS Swallow: 1 Procedure SLP visit diagnosis: SLP Visit Diagnosis: Dysphagia, unspecified (R13.10) Past Medical History: Past Medical History: Diagnosis Date  Arthritis   Essential hypertension 05/30/2022 Past Surgical History: Past Surgical History: Procedure Laterality Date  FINGER AMPUTATION    FOOT SURGERY   Happi Overton 06/01/2022, 11:00 AM  CT HEAD WO CONTRAST (5MM)  Result Date: 05/30/2022 CLINICAL DATA:  Mental status change EXAM: CT HEAD WITHOUT CONTRAST TECHNIQUE: Contiguous axial images were obtained from the base of the skull through the vertex without intravenous contrast. RADIATION DOSE REDUCTION: This exam  was performed according to the departmental dose-optimization program which includes automated exposure control, adjustment of the mA and/or kV according to patient size and/or use of iterative reconstruction technique. COMPARISON:  CT brain 11/21/2021 FINDINGS: Brain: No acute territorial infarction, hemorrhage or intracranial mass. Mild atrophy. Mild chronic small vessel ischemic changes of the white matter. Nonenlarged ventricles Vascular: No hyperdense vessels.  Carotid vascular calcification Skull: Normal. Negative for fracture or focal lesion. Sinuses/Orbits: No acute finding. Other: None IMPRESSION: 1. No CT evidence for acute intracranial abnormality. 2. Atrophy and chronic small vessel ischemic changes of the white matter. Electronically Signed   By: Donavan Foil M.D.   On: 05/30/2022 19:47   DG Chest 2 View  Result Date: 05/30/2022 CLINICAL DATA:  Possible sepsis.  Recent lung infection. EXAM: CHEST - 2 VIEW COMPARISON:  05/21/2022 and chest CT 05/23/2022 FINDINGS: Lungs are adequately inflated demonstrate evidence of focal opacification of the posterior right lower lobe without significant change and known to represent a subpleural fluid collection likely patient's reported infection/abscess. Small amount right  pleural fluid. Left lung is clear. Cardiomediastinal silhouette and remainder of the exam is unchanged. IMPRESSION: Stable focal opacification over the posterior right lower lobe likely patient's reported infection/abscess. Small amount right pleural fluid. Electronically Signed   By: Marin Olp M.D.   On: 05/30/2022 15:42     Labs:   Basic Metabolic Panel: Recent Labs  Lab 05/30/22 1458 05/31/22 0411  NA 133* 134*  K 3.9 3.6  CL 98 105  CO2 25 23  GLUCOSE 93 89  BUN 13 11  CREATININE 0.70 0.58*  CALCIUM 8.4* 8.0*  MG 2.0  --   PHOS 3.3  --    GFR Estimated Creatinine Clearance: 81.6 mL/min (A) (by C-G formula based on SCr of 0.58 mg/dL (L)). Liver Function Tests: Recent Labs  Lab 05/30/22 1458  AST 23  ALT 15  ALKPHOS 76  BILITOT 0.5  PROT 7.8  ALBUMIN 3.0*   No results for input(s): "LIPASE", "AMYLASE" in the last 168 hours. No results for input(s): "AMMONIA" in the last 168 hours. Coagulation profile Recent Labs  Lab 05/30/22 1458  INR 1.1    CBC: Recent Labs  Lab 05/30/22 1458 05/31/22 0411  WBC 12.0* 5.9  NEUTROABS 9.8*  --   HGB 11.6* 10.4*  HCT 37.4* 33.0*  MCV 87.4 85.7  PLT 361 275   Cardiac Enzymes: No results for input(s): "CKTOTAL", "CKMB", "CKMBINDEX", "TROPONINI" in the last 168 hours. BNP: Invalid input(s): "POCBNP" CBG: No results for input(s): "GLUCAP" in the last 168 hours. D-Dimer No results for input(s): "DDIMER" in the last 72 hours. Hgb A1c No results for input(s): "HGBA1C" in the last 72 hours. Lipid Profile No results for input(s): "CHOL", "HDL", "LDLCALC", "TRIG", "CHOLHDL", "LDLDIRECT" in the last 72 hours. Thyroid function studies No results for input(s): "TSH", "T4TOTAL", "T3FREE", "THYROIDAB" in the last 72 hours.  Invalid input(s): "FREET3" Anemia work up No results for input(s): "VITAMINB12", "FOLATE", "FERRITIN", "TIBC", "IRON", "RETICCTPCT" in the last 72 hours. Microbiology Recent Results (from the past  240 hour(s))  Culture, blood (Routine x 2)     Status: None (Preliminary result)   Collection Time: 05/30/22  2:58 PM   Specimen: BLOOD  Result Value Ref Range Status   Specimen Description BLOOD BLOOD LEFT ARM  Final   Special Requests   Final    BOTTLES DRAWN AEROBIC AND ANAEROBIC Blood Culture results may not be optimal due to an inadequate volume of blood received  in culture bottles   Culture   Final    NO GROWTH 2 DAYS Performed at [facility], [address] [place], [place] [address]    Report Status PENDING  Incomplete  Culture, blood (Routine x 2)     Status: None (Preliminary result)   Collection Time: 05/30/22  2:58 PM   Specimen: BLOOD  Result Value Ref Range Status   Specimen Description BLOOD BLOOD RIGHT FOREARM  Final   Special Requests   Final    BOTTLES DRAWN AEROBIC AND ANAEROBIC Blood Culture results may not be optimal due to an excessive volume of blood received in culture bottles   Culture   Final    NO GROWTH 2 DAYS Performed at [facility], [address], [place], [place] [address]    Report Status PENDING  Incomplete  MRSA Next Gen by PCR, Nasal     Status: None   Collection Time: 05/30/22  5:39 PM   Specimen: Nasal Mucosa; Nasal Swab  Result Value Ref Range Status   MRSA by PCR Next Gen NOT DETECTED NOT DETECTED Final    Comment: (NOTE) The GeneXpert MRSA Assay (FDA approved for NASAL specimens only), is one component of a comprehensive MRSA colonization surveillance program. It is not intended to diagnose MRSA infection nor to guide or monitor treatment for MRSA infections. Test performance is not FDA approved in patients less than 34 years old. Performed at [facility], [address] [place], [place] [address]   Culture, Respiratory w Gram Stain     Status: None (Preliminary result)   Collection Time: 05/31/22  2:59 PM   Specimen: Sputum; Respiratory  Result Value Ref Range Status   Specimen Description   Final     SPU Performed at [facility], [address] [place], [place] [address]    Special Requests   Final    NONE Performed at [facility], [address] [place], [place] [address]    Gram Stain   Final    FEW WBC PRESENT, PREDOMINANTLY PMN FEW GRAM POSITIVE COCCI    Culture   Final    TOO YOUNG TO READ Performed at [facility], [address] [place], [place] [address]    Report Status PENDING  Incomplete    Time coordinating discharge: 35 minutes  Signed: Santanna Olenik  [facility] 06/01/2022, 2:38 PM

## 2022-06-01 NOTE — Progress Notes (Signed)
Mobility Specialist - Progress Note   06/01/22 0903  Mobility  Activity Ambulated independently in hallway;Stood at bedside;Dangled on edge of bed  Level of Assistance Independent  Assistive Device None  Distance Ambulated (ft) 240 ft  Activity Response Tolerated well  Mobility Referral Yes  $Mobility charge 1 Mobility   Pt semi-supine in bed on RA upon arrival. Pt completes bed mobility, STS, and ambulates in hallway indep. Pt returns to bed with needs in reach.   Gretchen Short  Mobility Specialist  06/01/22 9:04 AM

## 2022-06-01 NOTE — Progress Notes (Signed)
Modified Barium Swallow Study  Patient Details  Name: Jaime Blackburn MRN: [id number] Date of Birth: Aug 24, 1945  Today's Date: 06/01/2022 - late entry for 05/31/2022  Modified Barium Swallow completed.  Full report located under Chart Review in the Imaging Section.  History of Present Illness This is 77 year old male with a history of osteoarthritis essential hypertension, dyslipidemia, BPH, aortic aneurysm initially came in with chief complaint of altered mental status and was noted to have left lower lung pneumonia.  Further imaging as concerning for abscess.  In the ED he was tachycardic and tachypneic but remained on room air.  Renal function was normal on admission and he did have leukocytosis.  Initially received Rocephin 2 g and clindamycin.  He endorses progressively worsening cough over the last 1 month.   Clinical Impression Pt presents with adequate oropharyngeal abilities when consuming nectar thick liquids, thin liquids, puree, graham crackers and whole barium tablet with thin liquids. At this time, pt's risk of aspiration is considered low and suspect it is not a factor within current PNA. No further ST services are indicated at this time. Factors that may increase risk of adverse event in presence of aspiration ([place] 2021): Reduced cognitive function  Swallow Evaluation Recommendations Recommendations: PO diet PO Diet Recommendation: Regular;Thin liquids (Level 0) Liquid Administration via: Cup Medication Administration: Whole meds with liquid Supervision: Patient able to self-feed Swallowing strategies  : Minimize environmental distractions;Slow rate;Small bites/sips Postural changes: Position pt fully upright for meals Oral care recommendations: Oral care BID (2x/day)    Jamee Keach B. Rutherford Nail, M.S., CCC-SLP, [place] Pathologist Certified Brain Injury [facility] Office  [phone] Ascom [phone] Fax [phone]   Jaime Blackburn Rutherford Nail 06/01/2022,10:58 AM

## 2022-06-01 NOTE — Evaluation (Signed)
Physical Therapy Evaluation Patient Details Name: Jaime Blackburn MRN: [id number] DOB: 10-10-1945 Today's Date: 06/01/2022  History of Present Illness  Mr. Jaime Blackburn is a 77 year old male with history of BPH, hyperlipidemia, aortic aneurysm, who presents emergency department for chief concerns of altered mental status in setting of recent diagnosis of left lower lung infection/abscess.  Clinical Impression  Patient up ambulating into bathroom independently upon arrival. Very pleasant. Alert, oriented. Patient ambulated 250 feet in hallway without AD, no assistance needed.  No lob, or difficulties noted with safety awareness or mobility. Patient does not require follow up PT at this time. He will benefit from continued ambulation with mobility specialist and nursing.         Recommendations for follow up therapy are one component of a multi-disciplinary discharge planning process, led by the attending physician.  Recommendations may be updated based on patient status, additional functional criteria and insurance authorization.  Follow Up Recommendations No PT follow up      Assistance Recommended at Discharge None  Patient can return home with the following       Equipment Recommendations None recommended by PT  Recommendations for Other Services       Functional Status Assessment Patient has not had a recent decline in their functional status     Precautions / Restrictions Precautions Precautions: None Restrictions Weight Bearing Restrictions: No      Mobility  Bed Mobility Overal bed mobility: Independent                  Transfers Overall transfer level: Independent                      Ambulation/Gait Ambulation/Gait assistance: Independent Gait Distance (Feet): 250 Feet Assistive device: None Gait Pattern/deviations: WFL(Within Functional Limits) Gait velocity: normal     General Gait Details: patient independently ambulating in room. ambulated out  in hallway with me, no AD, no lob. Carrying on conversation throughout session.  Stairs            Wheelchair Mobility    Modified Rankin (Stroke Patients Only)       Balance Overall balance assessment: Independent                                           Pertinent Vitals/Pain Pain Assessment Pain Assessment: No/denies pain    Home Living Family/patient expects to be discharged to:: Private residence Living Arrangements: Alone Available Help at Discharge: Family;Available PRN/intermittently;Friend(s) Type of Home: House Home Access: Stairs to enter   [employer] of Steps: couple   Home Layout: One level Home Equipment: None      Prior Function Prior Level of Function : Independent/Modified Independent;Driving             Mobility Comments: patient is independent ADLs Comments: independent     Hand Dominance        Extremity/Trunk Assessment   Upper Extremity Assessment Upper Extremity Assessment: Overall WFL for tasks assessed    Lower Extremity Assessment Lower Extremity Assessment: Overall WFL for tasks assessed    Cervical / Trunk Assessment Cervical / Trunk Assessment: Normal  Communication   Communication: No difficulties  Cognition Arousal/Alertness: Awake/alert Behavior During Therapy: WFL for tasks assessed/performed Overall Cognitive Status: Within Functional Limits for tasks assessed  General Comments: able to answer all questions appropriately, fully oriented.        General Comments      Exercises     Assessment/Plan    PT Assessment Patient does not need any further PT services  PT Problem List         PT Treatment Interventions      PT Goals (Current goals can be found in the Care Plan section)  Acute Rehab PT Goals Patient Stated Goal: to continue walking while here PT Goal Formulation: With patient Time For Goal Achievement:  06/01/22 Potential to Achieve Goals: Good    Frequency       Co-evaluation               AM-PAC PT "6 Clicks" Mobility  Outcome Measure Help needed turning from your back to your side while in a flat bed without using bedrails?: None Help needed moving from lying on your back to sitting on the side of a flat bed without using bedrails?: None Help needed moving to and from a bed to a chair (including a wheelchair)?: None Help needed standing up from a chair using your arms (e.g., wheelchair or bedside chair)?: None Help needed to walk in hospital room?: None Help needed climbing 3-5 steps with a railing? : None 6 Click Score: 24    End of Session   Activity Tolerance: Patient tolerated treatment well Patient left: in bed;with call bell/phone within reach Nurse Communication: Mobility status      Time: [id number] PT Time Calculation (min) (ACUTE ONLY): 10 min   Charges:   PT Evaluation $PT Eval Low Complexity: 1 Low          Jaime Blackburn, PT, GCS 06/01/22,10:47 AM

## 2022-06-03 LAB — CULTURE, RESPIRATORY W GRAM STAIN

## 2022-06-03 LAB — LEGIONELLA PNEUMOPHILA TOTAL AB: Legionella Pneumo Total Ab: <0.91 OD ratio (ref 0.00–0.90)

## 2022-06-04 ENCOUNTER — Ambulatory Visit
Admission: RE | Admit: 2022-06-04 | Discharge: 2022-06-04 | Disposition: A | Discharge status: Home / Self Care | Payer: Medicare Other | Source: Ambulatory Visit | Attending: Internal Medicine | Admitting: Internal Medicine

## 2022-06-04 DIAGNOSIS — Z87891 Personal history of nicotine dependence: Secondary | ICD-10-CM | POA: Insufficient documentation

## 2022-06-04 DIAGNOSIS — I714 Abdominal aortic aneurysm, without rupture, unspecified: Secondary | ICD-10-CM | POA: Insufficient documentation

## 2022-06-04 DIAGNOSIS — I10 Essential (primary) hypertension: Secondary | ICD-10-CM | POA: Insufficient documentation

## 2022-06-04 DIAGNOSIS — Z136 Encounter for screening for cardiovascular disorders: Secondary | ICD-10-CM | POA: Insufficient documentation

## 2022-06-04 DIAGNOSIS — I2510 Atherosclerotic heart disease of native coronary artery without angina pectoris: Secondary | ICD-10-CM | POA: Diagnosis present

## 2022-06-04 LAB — CULTURE, BLOOD (ROUTINE X 2)
Culture: NO GROWTH
Culture: NO GROWTH

## 2022-06-04 LAB — CRYPTOCOCCUS ANTIGEN, SERUM: Cryptococcus Antigen, Serum: NEGATIVE

## 2022-06-05 LAB — MISC LABCORP TEST (SEND OUT): Labcorp test code: 832599

## 2022-06-05 LAB — ASPERGILLUS ANTIGEN, BAL/SERUM: Aspergillus Ag, BAL/Serum: 0.03 Index (ref 0.00–0.49)

## 2022-06-07 LAB — HISTOPLASMA GAL'MANNAN AG SER: Histoplasma Gal'mannan Ag Ser: NEGATIVE (ref <0.5)

## 2022-07-18 ENCOUNTER — Other Ambulatory Visit: Payer: Self-pay

## 2022-07-18 DIAGNOSIS — J851 Abscess of lung with pneumonia: Secondary | ICD-10-CM

## 2022-07-25 ENCOUNTER — Ambulatory Visit: Payer: No Typology Code available for payment source

## 2022-07-29 ENCOUNTER — Other Ambulatory Visit: Payer: No Typology Code available for payment source

## 2022-08-06 ENCOUNTER — Ambulatory Visit: Admit: 2022-08-06 | Payer: No Typology Code available for payment source

## 2022-08-06 SURGERY — COLONOSCOPY WITH PROPOFOL
Anesthesia: General

## 2022-11-12 ENCOUNTER — Ambulatory Visit: Payer: No Typology Code available for payment source

## 2023-01-13 ENCOUNTER — Ambulatory Visit: Payer: No Typology Code available for payment source
